# Patient Record
Sex: Female | Born: 1970 | Race: White | Hispanic: No | Marital: Single | Smoking: Never smoker
Health system: Southern US, Community
[De-identification: ages and names within clinical notes are randomized; demographics above are authoritative.]

## PROBLEM LIST (undated history)

## (undated) DIAGNOSIS — M2550 Pain in unspecified joint: Secondary | ICD-10-CM

## (undated) DIAGNOSIS — K219 Gastro-esophageal reflux disease without esophagitis: Secondary | ICD-10-CM

## (undated) DIAGNOSIS — E782 Mixed hyperlipidemia: Secondary | ICD-10-CM

## (undated) DIAGNOSIS — M549 Dorsalgia, unspecified: Secondary | ICD-10-CM

## (undated) DIAGNOSIS — F419 Anxiety disorder, unspecified: Secondary | ICD-10-CM

## (undated) DIAGNOSIS — I10 Essential (primary) hypertension: Secondary | ICD-10-CM

## (undated) DIAGNOSIS — E119 Type 2 diabetes mellitus without complications: Secondary | ICD-10-CM

## (undated) DIAGNOSIS — F32A Depression, unspecified: Secondary | ICD-10-CM

## (undated) DIAGNOSIS — N979 Female infertility, unspecified: Secondary | ICD-10-CM

## (undated) DIAGNOSIS — K862 Cyst of pancreas: Secondary | ICD-10-CM

## (undated) DIAGNOSIS — K5900 Constipation, unspecified: Secondary | ICD-10-CM

## (undated) DIAGNOSIS — K589 Irritable bowel syndrome without diarrhea: Secondary | ICD-10-CM

## (undated) DIAGNOSIS — K760 Fatty (change of) liver, not elsewhere classified: Secondary | ICD-10-CM

## (undated) DIAGNOSIS — E669 Obesity, unspecified: Secondary | ICD-10-CM

## (undated) DIAGNOSIS — E282 Polycystic ovarian syndrome: Secondary | ICD-10-CM

## (undated) DIAGNOSIS — R011 Cardiac murmur, unspecified: Secondary | ICD-10-CM

## (undated) DIAGNOSIS — R739 Hyperglycemia, unspecified: Secondary | ICD-10-CM

## (undated) DIAGNOSIS — Z8739 Personal history of other diseases of the musculoskeletal system and connective tissue: Secondary | ICD-10-CM

## (undated) DIAGNOSIS — R079 Chest pain, unspecified: Secondary | ICD-10-CM

## (undated) DIAGNOSIS — E876 Hypokalemia: Secondary | ICD-10-CM

## (undated) DIAGNOSIS — J45909 Unspecified asthma, uncomplicated: Secondary | ICD-10-CM

## (undated) DIAGNOSIS — G4700 Insomnia, unspecified: Secondary | ICD-10-CM

## (undated) DIAGNOSIS — R809 Proteinuria, unspecified: Secondary | ICD-10-CM

## (undated) HISTORY — DX: Hyperglycemia, unspecified: R73.9

## (undated) HISTORY — DX: Gastro-esophageal reflux disease without esophagitis: K21.9

## (undated) HISTORY — DX: Proteinuria, unspecified: R80.9

## (undated) HISTORY — DX: Unspecified asthma, uncomplicated: J45.909

## (undated) HISTORY — DX: Polycystic ovarian syndrome: E28.2

## (undated) HISTORY — DX: Depression, unspecified: F32.A

## (undated) HISTORY — DX: Hypercalcemia: E83.52

## (undated) HISTORY — DX: Constipation, unspecified: K59.00

## (undated) HISTORY — DX: Cyst of pancreas: K86.2

## (undated) HISTORY — DX: Irritable bowel syndrome, unspecified: K58.9

## (undated) HISTORY — DX: Fatty (change of) liver, not elsewhere classified: K76.0

## (undated) HISTORY — DX: Hypokalemia: E87.6

## (undated) HISTORY — DX: Essential (primary) hypertension: I10

## (undated) HISTORY — PX: CHOLECYSTECTOMY: SHX55

## (undated) HISTORY — DX: Personal history of other diseases of the musculoskeletal system and connective tissue: Z87.39

## (undated) HISTORY — DX: Type 2 diabetes mellitus without complications: E11.9

## (undated) HISTORY — DX: Cardiac murmur, unspecified: R01.1

## (undated) HISTORY — DX: Pain in unspecified joint: M25.50

## (undated) HISTORY — DX: Female infertility, unspecified: N97.9

## (undated) HISTORY — DX: Obesity, unspecified: E66.9

## (undated) HISTORY — DX: Insomnia, unspecified: G47.00

## (undated) HISTORY — DX: Dorsalgia, unspecified: M54.9

## (undated) HISTORY — DX: Mixed hyperlipidemia: E78.2

## (undated) HISTORY — DX: Chest pain, unspecified: R07.9

## (undated) HISTORY — DX: Anxiety disorder, unspecified: F41.9

---

## 1998-02-04 ENCOUNTER — Ambulatory Visit (HOSPITAL_COMMUNITY): Admission: RE | Admit: 1998-02-04 | Discharge: 1998-02-04 | Payer: Self-pay | Admitting: Cardiology

## 2001-02-27 ENCOUNTER — Encounter: Payer: Self-pay | Admitting: Family Medicine

## 2001-02-27 ENCOUNTER — Ambulatory Visit (HOSPITAL_COMMUNITY): Admission: RE | Admit: 2001-02-27 | Discharge: 2001-02-27 | Payer: Self-pay | Admitting: Family Medicine

## 2001-03-28 ENCOUNTER — Encounter (INDEPENDENT_AMBULATORY_CARE_PROVIDER_SITE_OTHER): Payer: Self-pay | Admitting: Internal Medicine

## 2001-03-28 ENCOUNTER — Encounter: Payer: Self-pay | Admitting: Cardiology

## 2001-03-28 ENCOUNTER — Ambulatory Visit (HOSPITAL_COMMUNITY): Admission: RE | Admit: 2001-03-28 | Discharge: 2001-03-29 | Payer: Self-pay | Admitting: Cardiology

## 2003-10-11 ENCOUNTER — Emergency Department (HOSPITAL_COMMUNITY): Admission: EM | Admit: 2003-10-11 | Discharge: 2003-10-12 | Payer: Self-pay | Admitting: Emergency Medicine

## 2003-11-03 ENCOUNTER — Ambulatory Visit (HOSPITAL_COMMUNITY): Admission: RE | Admit: 2003-11-03 | Discharge: 2003-11-03 | Payer: Self-pay | Admitting: Family Medicine

## 2005-02-13 ENCOUNTER — Ambulatory Visit (HOSPITAL_COMMUNITY): Admission: RE | Admit: 2005-02-13 | Discharge: 2005-02-13 | Payer: Self-pay | Admitting: Family Medicine

## 2005-12-13 ENCOUNTER — Emergency Department (HOSPITAL_COMMUNITY): Admission: EM | Admit: 2005-12-13 | Discharge: 2005-12-13 | Payer: Self-pay | Admitting: Emergency Medicine

## 2006-01-30 LAB — CONVERTED CEMR LAB: Pap Smear: NORMAL

## 2006-03-27 ENCOUNTER — Emergency Department (HOSPITAL_COMMUNITY): Admission: EM | Admit: 2006-03-27 | Discharge: 2006-03-27 | Payer: Self-pay | Admitting: Emergency Medicine

## 2006-03-30 ENCOUNTER — Emergency Department (HOSPITAL_COMMUNITY): Admission: EM | Admit: 2006-03-30 | Discharge: 2006-03-30 | Payer: Self-pay | Admitting: Emergency Medicine

## 2006-05-10 ENCOUNTER — Encounter: Admission: RE | Admit: 2006-05-10 | Discharge: 2006-05-10 | Payer: Self-pay | Admitting: Family Medicine

## 2006-05-11 ENCOUNTER — Encounter: Admission: RE | Admit: 2006-05-11 | Discharge: 2006-05-11 | Payer: Self-pay | Admitting: Family Medicine

## 2006-07-01 ENCOUNTER — Encounter (INDEPENDENT_AMBULATORY_CARE_PROVIDER_SITE_OTHER): Payer: Self-pay | Admitting: Internal Medicine

## 2006-08-29 ENCOUNTER — Ambulatory Visit: Payer: Self-pay | Admitting: Internal Medicine

## 2006-08-29 DIAGNOSIS — I10 Essential (primary) hypertension: Secondary | ICD-10-CM | POA: Insufficient documentation

## 2006-08-29 DIAGNOSIS — J309 Allergic rhinitis, unspecified: Secondary | ICD-10-CM | POA: Insufficient documentation

## 2006-09-27 ENCOUNTER — Ambulatory Visit: Payer: Self-pay | Admitting: Internal Medicine

## 2006-09-30 ENCOUNTER — Telehealth (INDEPENDENT_AMBULATORY_CARE_PROVIDER_SITE_OTHER): Payer: Self-pay | Admitting: Internal Medicine

## 2006-10-02 ENCOUNTER — Ambulatory Visit: Payer: Self-pay | Admitting: Internal Medicine

## 2006-10-02 DIAGNOSIS — F329 Major depressive disorder, single episode, unspecified: Secondary | ICD-10-CM | POA: Insufficient documentation

## 2006-10-02 DIAGNOSIS — G4700 Insomnia, unspecified: Secondary | ICD-10-CM | POA: Insufficient documentation

## 2006-10-02 DIAGNOSIS — K862 Cyst of pancreas: Secondary | ICD-10-CM | POA: Insufficient documentation

## 2006-10-02 DIAGNOSIS — K863 Pseudocyst of pancreas: Secondary | ICD-10-CM

## 2006-10-15 ENCOUNTER — Encounter (INDEPENDENT_AMBULATORY_CARE_PROVIDER_SITE_OTHER): Payer: Self-pay | Admitting: Internal Medicine

## 2006-10-25 ENCOUNTER — Encounter (INDEPENDENT_AMBULATORY_CARE_PROVIDER_SITE_OTHER): Payer: Self-pay | Admitting: Internal Medicine

## 2006-11-08 ENCOUNTER — Ambulatory Visit: Payer: Self-pay | Admitting: Internal Medicine

## 2006-11-08 DIAGNOSIS — D239 Other benign neoplasm of skin, unspecified: Secondary | ICD-10-CM | POA: Insufficient documentation

## 2006-11-11 ENCOUNTER — Encounter (INDEPENDENT_AMBULATORY_CARE_PROVIDER_SITE_OTHER): Payer: Self-pay | Admitting: Internal Medicine

## 2006-12-04 ENCOUNTER — Encounter (INDEPENDENT_AMBULATORY_CARE_PROVIDER_SITE_OTHER): Payer: Self-pay | Admitting: Internal Medicine

## 2007-02-13 ENCOUNTER — Ambulatory Visit: Payer: Self-pay | Admitting: Internal Medicine

## 2007-02-27 ENCOUNTER — Encounter (INDEPENDENT_AMBULATORY_CARE_PROVIDER_SITE_OTHER): Payer: Self-pay | Admitting: Internal Medicine

## 2007-02-27 LAB — CONVERTED CEMR LAB
BUN: 9 mg/dL (ref 6–23)
CO2: 30 meq/L (ref 19–32)
Chloride: 103 meq/L (ref 96–112)
Potassium: 3.6 meq/L (ref 3.5–5.3)

## 2007-03-07 ENCOUNTER — Encounter (INDEPENDENT_AMBULATORY_CARE_PROVIDER_SITE_OTHER): Payer: Self-pay | Admitting: Internal Medicine

## 2007-03-08 ENCOUNTER — Encounter: Admission: RE | Admit: 2007-03-08 | Discharge: 2007-03-08 | Payer: Self-pay | Admitting: Internal Medicine

## 2007-03-11 ENCOUNTER — Encounter (INDEPENDENT_AMBULATORY_CARE_PROVIDER_SITE_OTHER): Payer: Self-pay | Admitting: Internal Medicine

## 2007-03-12 ENCOUNTER — Encounter (INDEPENDENT_AMBULATORY_CARE_PROVIDER_SITE_OTHER): Payer: Self-pay | Admitting: Internal Medicine

## 2007-03-13 ENCOUNTER — Encounter (INDEPENDENT_AMBULATORY_CARE_PROVIDER_SITE_OTHER): Payer: Self-pay | Admitting: Internal Medicine

## 2007-03-13 LAB — CONVERTED CEMR LAB

## 2007-03-28 ENCOUNTER — Encounter (INDEPENDENT_AMBULATORY_CARE_PROVIDER_SITE_OTHER): Payer: Self-pay | Admitting: Internal Medicine

## 2007-03-31 LAB — CONVERTED CEMR LAB
Albumin: 4.3 g/dL (ref 3.5–5.2)
CO2: 25 meq/L (ref 19–32)
Calcium: 8.9 mg/dL (ref 8.4–10.5)
Cholesterol: 210 mg/dL — ABNORMAL HIGH (ref 0–200)
Eosinophils Relative: 3 % (ref 0–5)
Glucose, Bld: 108 mg/dL — ABNORMAL HIGH (ref 70–99)
HCT: 46.4 % — ABNORMAL HIGH (ref 36.0–46.0)
Lymphocytes Relative: 22 % (ref 12–46)
Lymphs Abs: 1.7 10*3/uL (ref 0.7–3.3)
Neutrophils Relative %: 70 % (ref 43–77)
Platelets: 347 10*3/uL (ref 150–400)
Potassium: 3.7 meq/L (ref 3.5–5.3)
Sodium: 144 meq/L (ref 135–145)
Total Protein: 7.1 g/dL (ref 6.0–8.3)
Triglycerides: 512 mg/dL — ABNORMAL HIGH (ref <150)
WBC: 8 10*3/uL (ref 4.0–10.5)

## 2007-04-03 ENCOUNTER — Encounter (INDEPENDENT_AMBULATORY_CARE_PROVIDER_SITE_OTHER): Payer: Self-pay | Admitting: Internal Medicine

## 2007-04-10 ENCOUNTER — Ambulatory Visit (HOSPITAL_COMMUNITY): Admission: RE | Admit: 2007-04-10 | Discharge: 2007-04-10 | Payer: Self-pay | Admitting: Internal Medicine

## 2007-04-10 ENCOUNTER — Ambulatory Visit: Payer: Self-pay | Admitting: Internal Medicine

## 2007-05-08 ENCOUNTER — Telehealth (INDEPENDENT_AMBULATORY_CARE_PROVIDER_SITE_OTHER): Payer: Self-pay | Admitting: *Deleted

## 2007-05-14 ENCOUNTER — Telehealth (INDEPENDENT_AMBULATORY_CARE_PROVIDER_SITE_OTHER): Payer: Self-pay | Admitting: Internal Medicine

## 2007-06-19 ENCOUNTER — Telehealth (INDEPENDENT_AMBULATORY_CARE_PROVIDER_SITE_OTHER): Payer: Self-pay | Admitting: *Deleted

## 2007-08-27 ENCOUNTER — Telehealth (INDEPENDENT_AMBULATORY_CARE_PROVIDER_SITE_OTHER): Payer: Self-pay | Admitting: Internal Medicine

## 2007-08-28 DIAGNOSIS — R7309 Other abnormal glucose: Secondary | ICD-10-CM | POA: Insufficient documentation

## 2007-09-30 ENCOUNTER — Encounter (INDEPENDENT_AMBULATORY_CARE_PROVIDER_SITE_OTHER): Payer: Self-pay | Admitting: Internal Medicine

## 2007-11-03 ENCOUNTER — Telehealth (INDEPENDENT_AMBULATORY_CARE_PROVIDER_SITE_OTHER): Payer: Self-pay | Admitting: *Deleted

## 2007-11-12 ENCOUNTER — Ambulatory Visit: Payer: Self-pay | Admitting: Internal Medicine

## 2007-11-12 DIAGNOSIS — R609 Edema, unspecified: Secondary | ICD-10-CM | POA: Insufficient documentation

## 2007-12-10 ENCOUNTER — Encounter (INDEPENDENT_AMBULATORY_CARE_PROVIDER_SITE_OTHER): Payer: Self-pay | Admitting: Internal Medicine

## 2008-02-16 ENCOUNTER — Telehealth (INDEPENDENT_AMBULATORY_CARE_PROVIDER_SITE_OTHER): Payer: Self-pay | Admitting: *Deleted

## 2008-03-03 ENCOUNTER — Encounter (INDEPENDENT_AMBULATORY_CARE_PROVIDER_SITE_OTHER): Payer: Self-pay | Admitting: Internal Medicine

## 2008-04-19 ENCOUNTER — Encounter (INDEPENDENT_AMBULATORY_CARE_PROVIDER_SITE_OTHER): Payer: Self-pay | Admitting: Internal Medicine

## 2008-05-26 ENCOUNTER — Encounter (INDEPENDENT_AMBULATORY_CARE_PROVIDER_SITE_OTHER): Payer: Self-pay | Admitting: Internal Medicine

## 2008-06-09 ENCOUNTER — Ambulatory Visit: Payer: Self-pay | Admitting: Internal Medicine

## 2008-06-09 DIAGNOSIS — E785 Hyperlipidemia, unspecified: Secondary | ICD-10-CM | POA: Insufficient documentation

## 2008-12-22 ENCOUNTER — Ambulatory Visit: Payer: Self-pay | Admitting: Internal Medicine

## 2009-02-22 ENCOUNTER — Ambulatory Visit: Payer: Self-pay | Admitting: Internal Medicine

## 2009-02-22 DIAGNOSIS — L909 Atrophic disorder of skin, unspecified: Secondary | ICD-10-CM | POA: Insufficient documentation

## 2009-02-22 DIAGNOSIS — L919 Hypertrophic disorder of the skin, unspecified: Secondary | ICD-10-CM

## 2009-03-09 ENCOUNTER — Encounter (INDEPENDENT_AMBULATORY_CARE_PROVIDER_SITE_OTHER): Payer: Self-pay | Admitting: Internal Medicine

## 2010-07-30 LAB — CONVERTED CEMR LAB
Albumin: 4.4 g/dL
Alkaline Phosphatase: 92 units/L
CO2: 27 meq/L
Calcium: 9.2 mg/dL
Chloride: 104 meq/L
Cholesterol: 200 mg/dL
Glucose, Bld: 90 mg/dL
Lymphocytes Relative: 24 %
Lymphs Abs: 2.5 10*3/uL
Monocytes Relative: 6 %
Neutro Abs: 6.9 10*3/uL
Neutrophils Relative %: 68 %
Platelets: 383 10*3/uL
Potassium: 4.1 meq/L
RBC: 5.12 M/uL
Sodium: 143 meq/L
Total CHOL/HDL Ratio: 4.1
Total Protein: 7.3 g/dL
Triglycerides: 319 mg/dL
WBC: 10.2 10*3/uL

## 2010-11-17 NOTE — Cardiovascular Report (Signed)
[facility]  Patient:    Loretta Luna, Loretta Luna Visit Number: [id number] MRN: [id number]          Service Type: CAT Location: [facility] 2858 [id number] Attending Physician:  Ophelia Shoulder Dictated by:   Madaline Savage, M.D. Proc. Date: 03/28/01 Admit Date:  03/28/2001                          Cardiac Catheterization  PROCEDURE: 1. Selective coronary angiography by Judkins technique. 2. Retrograde left heart catheterization. 3. Left ventricular angiography.  Complications:  None. Entry site:     Right femoral artery. Dye used:       Omnipaque. Catheters:      Judkins 4.0 left, Judkins 4.0 right.                 Arterial: 87F pigtail, 87F introducer sheath.  PATIENT PROFILE:  The patient is a 40 year old female with a strong family history of coronary artery disease who takes oral contraceptives, who has had some episodes of chest discomfort.  A Persantine Cardiolite stress test recently performed showed suggestion of anterolateral ischemia with a normal ejection fraction.  She presents today for outpatient cardiac catheterization in view of her abnormal Cardiolite.  PRESSURES:  The left ventricular pressure and central aortic pressure were both normal.  There was no gradient by pullback technique.  ANGIOGRAPHIC RESULTS:  The left main coronary artery was normal.  The left anterior descending coronary artery gave rise to one major diagonal branch and neither the LAD nor the diagonal showed lesions.  The circumflex was a dominant vessel giving rise to two obtuse marginal branches and a large posterior descending branch.  The right coronary artery was a medium-size vessel with no lesions.  LEFT VENTRICULOGRAPHY:  The left ventricular angiography shows normal contractility.  Ejection fraction was normal and estimated at 60%.  No mitral regurgitation was seen.  FINAL DIAGNOSES: 1. Normal coronary arteries. 2. Normal left ventricular systolic  function.  ADDENDUM:  The patient was screened for Perclose arteriotomy by performing a 45 degree RAO hand-injected angiogram of her femoral artery.  When the Perclose device was placed, the device did not capture tissue and was therefore unsuccessful and digital manual pressure was held instead. Dictated by:   Madaline Savage, M.D. Attending Physician:  Ophelia Shoulder DD:  03/28/01 TD:  03/28/01 Job: [id number]

## 2010-11-17 NOTE — Procedures (Signed)
Loretta Luna, Loretta Luna                 ACCOUNT NO.:  [id number]   MEDICAL RECORD NO.:  [id number]          PATIENT TYPE:  OUT   LOCATION:  RAD                           FACILITY:  [facility]   PHYSICIAN:  Madaline Savage, M.D.DATE OF BIRTH:  Mar 20, 1971   DATE OF PROCEDURE:  02/13/2005  DATE OF DISCHARGE:                                  ECHOCARDIOGRAM   PROCEDURES:  Two-dimensional echocardiogram.   REFERRING PHYSICIAN:  Corrie Mckusick, M.D.   DATE OF STUDY:  February 13, 2005.   INDICATIONS FOR PROCEDURE:  Cardiac murmur and new edema.  The patient has a  history of hypertension and is 40 years of age.   Technically the study was suboptimal in terms of image quality but adequate.   RESULTS:  1.  Aorta: Normal aortic root dimension noted of 2.5.  2.  Left atrium:  Left atrial size normal.  3.  Right atrium:  Normal.  4.  Right ventricle:  Not well seen, probably within normal limits.  5.  Left ventricle:  Borderline left ventricular hypertrophy of a concentric      nature.  Posterior and septal wall measurements 1.1.  LV size normal,      end-systolic dimension 2.9 and diastolic dimension 4.  Ejection fraction      felt to be approximately 55 to 60%.  No wall motion abnormalities      appreciated.  6.  Right ventricle:  Not well seen.  7.  Pericardium:  No effusion noted.  8.  Aortic valve:  Aortic valve opens and closes normally.  Mean gradient      across valve 8.  9.  Mitral valve: Unremarkable.  10. Pulmonic valve:  Not well seen.  11. Tricuspid valve:  Not well seen, probably normal.  No TR seen.   FINAL DIAGNOSES:  1.  No significant valvular disease noted.  2.  Right ventricle not well seen.  3.  Left ventricular systolic function normal.  4.  Borderline left ventricular hypertrophy.           ______________________________  Madaline Savage, M.D.     WHG/MEDQ  D:  02/13/2005  T:  02/13/2005  Job:  [id number]   cc:   Corrie Mckusick, M.D.  Fax: [phone]   [facility]  [place]   [facility]

## 2011-02-23 ENCOUNTER — Ambulatory Visit: Payer: PRIVATE HEALTH INSURANCE | Attending: Internal Medicine | Admitting: Sleep Medicine

## 2011-02-23 DIAGNOSIS — R5383 Other fatigue: Secondary | ICD-10-CM

## 2011-02-23 DIAGNOSIS — G4730 Sleep apnea, unspecified: Secondary | ICD-10-CM | POA: Insufficient documentation

## 2011-02-23 DIAGNOSIS — Z6841 Body Mass Index (BMI) 40.0 and over, adult: Secondary | ICD-10-CM | POA: Insufficient documentation

## 2011-02-23 DIAGNOSIS — G4710 Hypersomnia, unspecified: Secondary | ICD-10-CM | POA: Insufficient documentation

## 2011-02-23 DIAGNOSIS — R5381 Other malaise: Secondary | ICD-10-CM

## 2011-02-25 NOTE — Procedures (Signed)
Loretta Luna, Loretta Luna                 ACCOUNT NO.:  [id number]  MEDICAL RECORD NO.:  [id number]          PATIENT TYPE:  OUT  LOCATION:  SLEEP LAB                     FACILITY:  [facility]  PHYSICIAN:  Ripken Rekowski A. Gerilyn Pilgrim, M.D. DATE OF BIRTH:  December 03, 1970  DATE OF STUDY:  02/23/2011                           NOCTURNAL POLYSOMNOGRAM  REFERRING PHYSICIAN:  ZACK HALL  INDICATION FOR STUDY:  A 40 year old who presents with significant hypersomnia, fatigue and snoring.  Study is being done to evaluate for obstructive sleep apnea syndrome.  EPWORTH SLEEPINESS SCORE: 1. BMI 49.  MEDICATIONS:  Pravachol, Zoloft, pravastatin, Glucophage, Avapro.  SLEEP ARCHITECTURE:  The total recording time is 457 minutes.  Sleep efficiency 83%.  Sleep latency 20 minutes.  REM latency 160 minutes. Stage N1 is 6.2%, N2 54%, N3 29%, and REM sleep 11%.  RESPIRATORY DATA:  Baseline oxygen saturation is 98.  Lowest saturation 90.  Diagnostic AHI is 1.4 and RDI 2.  LIMB MOVEMENT SUMMARY:  PLM index 1.  ELECTROCARDIOGRAM SUMMARY:  Average heart rate is 80 with no significant dysrhythmias observed.  IMPRESSION:  Unremarkable nocturnal polysomnography report.  Thanks for this referral.     Donny Heffern A. Gerilyn Pilgrim, M.D.    KAD/MEDQ  D:  02/24/2011 19:31:13  T:  02/25/2011 02:52:46  Job:  [id number]

## 2011-09-03 ENCOUNTER — Encounter: Payer: 59 | Attending: Internal Medicine | Admitting: *Deleted

## 2011-09-03 VITALS — Ht 63.5 in | Wt 271.2 lb

## 2011-09-03 DIAGNOSIS — R7309 Other abnormal glucose: Secondary | ICD-10-CM

## 2011-09-10 ENCOUNTER — Encounter: Payer: Self-pay | Admitting: *Deleted

## 2011-09-12 ENCOUNTER — Encounter: Payer: Self-pay | Admitting: *Deleted

## 2011-09-12 NOTE — Progress Notes (Signed)
  Patient was seen on 09/03/2011 for the first of a series of three diabetes self-management courses at [facility]. The following learning objectives were met by the patient during this course:   Defines the role of glucose and insulin  Identifies type of diabetes and pathophysiology  Defines the diagnostic criteria for diabetes and prediabetes  States the risk factors for Type 2 Diabetes  States the symptoms of Type 2 Diabetes  Defines Type 2 Diabetes treatment goals  Defines Type 2 Diabetes treatment options  States the rationale for glucose monitoring  Identifies A1C, glucose targets, and testing times  Identifies proper sharps disposal  Defines the purpose of a diabetes food plan  Identifies carbohydrate food groups  Defines effects of carbohydrate foods on glucose levels  Identifies carbohydrate choices/grams/food labels  States benefits of physical activity and effect on glucose  Review of suggested activity guidelines  Handouts given during class include:  Type 2 Diabetes: Basics Book  My Food Plan Book  Food and Activity Log  Follow-Up Plan: Attend Core 2 Class on April 9th, 2013

## 2011-09-12 NOTE — Patient Instructions (Signed)
Goals:  Follow Diabetes Meal Plan as instructed  Eat 3 meals and 2 snacks, every 3-5 hrs  Limit carbohydrate intake to 30-45 grams carbohydrate/meal  Limit carbohydrate intake to 0-15 grams carbohydrate/snack  Add lean protein foods to meals/snacks  Monitor glucose levels as instructed by your doctor  Aim for 15-30 mins of physical activity daily  Bring food record and glucose log to your next nutrition visit   

## 2011-10-09 ENCOUNTER — Encounter: Payer: 59 | Attending: Internal Medicine | Admitting: Dietician

## 2011-10-10 NOTE — Progress Notes (Signed)
  Patient was seen on 10/09/2011 for the second of a series of three diabetes self-management courses at [facility]. The following learning objectives were met by the patient during this course:   Explain basic nutrition maintenance and quality assurance  Describe causes, symptoms and treatment of hypoglycemia and hyperglycemia  Explain how to manage diabetes during illness  Describe the importance of good nutrition for health and healthy eating strategies  List strategies to follow meal plan when dining out  Describe the effects of alcohol on glucose and how to use it safely  Describe problem solving skills for day-to-day glucose challenges  Describe strategies to use when treatment plan needs to change  Identify important factors involved in successful weight loss  Describe ways to remain physically active  Describe the impact of regular activity on insulin resistance  Patient updated their initials goals from Core Class I to include: Continues to finalize self-management goals for Core Class 3.  Handouts given in class:  Refrigerator magnet for Sick Day Guidelines  [facility] Oral medication/insulin handout  Follow-Up Plan: Patient will attend the final class of the ADA Diabetes Self-Care Education.

## 2011-11-20 ENCOUNTER — Encounter: Payer: 59 | Attending: Internal Medicine

## 2011-11-20 DIAGNOSIS — I10 Essential (primary) hypertension: Secondary | ICD-10-CM | POA: Insufficient documentation

## 2011-11-20 DIAGNOSIS — Z713 Dietary counseling and surveillance: Secondary | ICD-10-CM | POA: Insufficient documentation

## 2011-11-20 DIAGNOSIS — R7309 Other abnormal glucose: Secondary | ICD-10-CM

## 2011-11-20 DIAGNOSIS — E119 Type 2 diabetes mellitus without complications: Secondary | ICD-10-CM | POA: Insufficient documentation

## 2011-11-26 NOTE — Progress Notes (Signed)
  Patient was seen on 11/20/2011 for the third of a series of three diabetes self-management courses at the [facility]. The following learning objectives were met by the patient during this course:    Describe how diabetes changes over time   Identify diabetes complications and ways to prevent them   Describe strategies that can promote heart health including lowering blood pressure and cholesterol   Describe strategies to lower dietary fat and sodium in the diet   Identify physical activities that benefit cardiovascular health   Evaluate success in meeting personal goal   Describe the belief that they can live successfully with diabetes day to day   Establish 2-3 goals that they will plan to diligently work on until they return for the free 40-month follow-up visit  The following handouts were given in class:  3 Month Follow Up Visit handout  Goal setting handout  Class evaluation form  Your patient has established the following 3 month goals for diabetes self-care:  Count carbohydrates at most of my meals and snacks.  Be active 20 or more minutes or more 3 times a week.   Take my diabetes medications as scheduled.  Follow-Up Plan: Patient will attend a 3 month follow-up visit for diabetes self-management education.

## 2012-02-21 ENCOUNTER — Ambulatory Visit: Payer: 59 | Admitting: Dietician

## 2012-03-04 ENCOUNTER — Encounter: Payer: 59 | Attending: Internal Medicine | Admitting: Dietician

## 2012-03-04 VITALS — Ht 63.5 in | Wt 260.0 lb

## 2012-03-04 DIAGNOSIS — Z713 Dietary counseling and surveillance: Secondary | ICD-10-CM | POA: Insufficient documentation

## 2012-03-04 DIAGNOSIS — R7309 Other abnormal glucose: Secondary | ICD-10-CM

## 2012-03-04 DIAGNOSIS — E119 Type 2 diabetes mellitus without complications: Secondary | ICD-10-CM | POA: Insufficient documentation

## 2012-03-04 NOTE — Progress Notes (Signed)
  Patient was seen on 03/04/2012 for their 3 month follow-up as a part of the diabetes self-management courses at the [facility]. 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  She has lost some weight.  Wishes to discuss starting the Victoza with Dr. Margo Aye Confidence with ability to manage diabetes: Feels more confident Areas for improvement with diabetes self-care: Increase her exercise, lose more weight. Willingness to participate in diabetes support group: Issues with the long distance commute to attend. Checking her feet: yes Dilated eye exam in the last year: yes Checking blood glucose: not as often:  Only 2-3 times per week Following a meal plan: yes Regular exercise: Not at this time with the warmer weather.  Looking to start up at a gym in the near future. Weight Loss: yes 7.2 lb since class 1 in march.  Please see Diabetes Flow sheet for findings related to patient's self-care.  Follow-Up Plan: Patient is eligible for a "free" 30 minute diabetes self-care appointment in the next year. Patient to call and schedule as needed.

## 2012-03-19 ENCOUNTER — Telehealth: Payer: Self-pay | Admitting: *Deleted

## 2012-03-20 NOTE — Telephone Encounter (Signed)
Patient notified of Path results.   

## 2012-07-15 ENCOUNTER — Telehealth (INDEPENDENT_AMBULATORY_CARE_PROVIDER_SITE_OTHER): Payer: Self-pay | Admitting: *Deleted

## 2012-07-15 DIAGNOSIS — R197 Diarrhea, unspecified: Secondary | ICD-10-CM

## 2012-07-15 DIAGNOSIS — R109 Unspecified abdominal pain: Secondary | ICD-10-CM

## 2012-07-15 LAB — BASIC METABOLIC PANEL
CO2: 28 mEq/L (ref 19–32)
Chloride: 100 mEq/L (ref 96–112)
Creat: 0.77 mg/dL (ref 0.50–1.10)
Potassium: 3.4 mEq/L — ABNORMAL LOW (ref 3.5–5.3)

## 2012-07-15 LAB — CBC WITH DIFFERENTIAL/PLATELET
Eosinophils Relative: 2 % (ref 0–5)
HCT: 38 % (ref 36.0–46.0)
Lymphocytes Relative: 19 % (ref 12–46)
Lymphs Abs: 1.5 10*3/uL (ref 0.7–4.0)
MCV: 78 fL (ref 78.0–100.0)
Monocytes Absolute: 0.6 10*3/uL (ref 0.1–1.0)
Platelets: 296 10*3/uL (ref 150–400)
RBC: 4.87 MIL/uL (ref 3.87–5.11)
WBC: 8.2 10*3/uL (ref 4.0–10.5)

## 2012-07-15 NOTE — Telephone Encounter (Signed)
Per Dr.Rehman the patient may get the following labs done  CBC/D/Met 7

## 2012-10-16 ENCOUNTER — Telehealth (INDEPENDENT_AMBULATORY_CARE_PROVIDER_SITE_OTHER): Payer: Self-pay | Admitting: Internal Medicine

## 2012-10-16 DIAGNOSIS — K649 Unspecified hemorrhoids: Secondary | ICD-10-CM

## 2012-10-16 MED ORDER — HYDROCORTISONE ACE-PRAMOXINE 1-1 % RE FOAM: 1.0000 | Freq: Two times a day (BID) | RECTAL | Status: AC

## 2012-10-16 NOTE — Telephone Encounter (Signed)
Rx eprescribed 

## 2013-10-06 ENCOUNTER — Other Ambulatory Visit (HOSPITAL_COMMUNITY): Payer: Self-pay | Admitting: Internal Medicine

## 2013-10-06 DIAGNOSIS — Z1231 Encounter for screening mammogram for malignant neoplasm of breast: Secondary | ICD-10-CM

## 2013-10-09 ENCOUNTER — Ambulatory Visit (HOSPITAL_COMMUNITY)
Admission: RE | Admit: 2013-10-09 | Discharge: 2013-10-09 | Disposition: A | Discharge status: Home / Self Care | Payer: 59 | Source: Ambulatory Visit | Attending: Internal Medicine | Admitting: Internal Medicine

## 2013-10-09 DIAGNOSIS — Z1231 Encounter for screening mammogram for malignant neoplasm of breast: Secondary | ICD-10-CM | POA: Insufficient documentation

## 2014-03-15 ENCOUNTER — Other Ambulatory Visit (INDEPENDENT_AMBULATORY_CARE_PROVIDER_SITE_OTHER): Payer: Self-pay | Admitting: Internal Medicine

## 2014-05-21 ENCOUNTER — Telehealth (INDEPENDENT_AMBULATORY_CARE_PROVIDER_SITE_OTHER): Payer: Self-pay | Admitting: Internal Medicine

## 2014-05-21 DIAGNOSIS — J322 Chronic ethmoidal sinusitis: Secondary | ICD-10-CM

## 2014-05-21 MED ORDER — AZITHROMYCIN 250 MG PO TABS: ORAL_TABLET | ORAL | Status: DC

## 2014-05-21 NOTE — Telephone Encounter (Signed)
Sinusitis. Will call in Z pack.

## 2014-07-05 ENCOUNTER — Telehealth (INDEPENDENT_AMBULATORY_CARE_PROVIDER_SITE_OTHER): Payer: Self-pay | Admitting: Internal Medicine

## 2014-07-05 NOTE — Telephone Encounter (Signed)
error 

## 2014-09-02 ENCOUNTER — Telehealth (INDEPENDENT_AMBULATORY_CARE_PROVIDER_SITE_OTHER): Payer: Self-pay | Admitting: Internal Medicine

## 2014-09-02 MED ORDER — AMOXICILLIN 500 MG PO CAPS: 500.0000 mg | ORAL_CAPSULE | Freq: Three times a day (TID) | ORAL | Status: DC

## 2014-09-02 NOTE — Telephone Encounter (Signed)
Rx for amoxicillin sent to pharmacy for sorethroat.

## 2015-01-24 ENCOUNTER — Encounter: Payer: Self-pay | Admitting: *Deleted

## 2015-01-24 ENCOUNTER — Other Ambulatory Visit: Payer: Self-pay | Admitting: *Deleted

## 2015-01-24 NOTE — Patient Outreach (Signed)
[facility]) Care Management   01/24/2015  Loretta Luna 1970/08/17 [id number]  Loretta Luna is an 44 y.o. female who presents for routine [facility] To [facility]ness follow up for Type II DM self management assistance. She brings her lab results from 12/22/14 with her.  Subjective:  Loretta Luna has no complaints, says she had her follow up ultrasound of her pancreas in April at [facility] to monitor a cyst discovered in 2007 and said she was not called with the results so she assumes the cyst was unchanged. She says her 71 year old son Loretta Luna Kitchen broke his hand on the last day of school and he is still in a brace. He will be starting high school in the fall. She says she is pleased her A1C and triglycerides have improved. She attributes the improvement to changes made in her medication regimen: Invokana, Zetia, Crestor and the resumption of Lovaza.  Objective:   Review of Systems  Constitutional: Negative.    Filed Vitals:   01/24/15 1245  BP: 110/60  POC pre lunch CBG= 120  Physical Exam  Constitutional: She is oriented to person, place, and time. She appears well-developed and well-nourished.  Neurological: She is alert and oriented to person, place, and time.  Skin: Skin is warm and dry.  Psychiatric: She has a normal mood and affect. Her behavior is normal. Thought content normal.    Current Medications:   Outpatient Encounter Prescriptions as of 01/24/2015  Medication Sig Note  . Azilsartan-Chlorthalidone (EDARBYCLOR) 40-25 MG TABS Take by mouth daily.   . canagliflozin (INVOKANA) 300 MG TABS tablet Take 300 mg by mouth daily before breakfast.   . ezetimibe (ZETIA) 10 MG tablet Take 10 mg by mouth daily.   . fenofibrate micronized (ANTARA) 130 MG capsule Take 130 mg by mouth daily before breakfast.   . fluconazole (DIFLUCAN) 100 MG tablet TAKE ONE TABLET DAILY AS NEEDED. 01/24/2015: Takes prn while on Invokana  . furosemide (LASIX) 20 MG tablet Take 20 mg by mouth daily.    . Liraglutide (VICTOZA) 18 MG/3ML SOLN Inject into the skin.   . montelukast (SINGULAIR) 10 MG tablet Take 10 mg by mouth at bedtime.   Loretta Luna Kitchen omega-3 acid ethyl esters (LOVAZA) 1 G capsule Take 1 g by mouth 2 (two) times daily.   . rosuvastatin (CRESTOR) 20 MG tablet Take 20 mg by mouth daily.   Loretta Luna Kitchen amoxicillin (AMOXIL) 500 MG capsule Take 1 capsule (500 mg total) by mouth 3 (three) times daily. (Patient not taking: Reported on 01/24/2015)   . azithromycin (ZITHROMAX Z-PAK) 250 MG tablet As directed (Patient not taking: Reported on 01/24/2015)   . fish oil-omega-3 fatty acids 1000 MG capsule Take 600 mg by mouth 2 (two) times daily. 01/24/2015: No longer taking now that she is back on Lovaza  . irbesartan (AVAPRO) 150 MG tablet Take 150 mg by mouth daily.     . metFORMIN (GLUCOPHAGE) 500 MG tablet Take 500 mg by mouth 2 (two) times daily with a meal.   01/24/2015: Did not tolerate due to diarrhea  . pravastatin (PRAVACHOL) 40 MG tablet Take 40 mg by mouth daily.     . sertraline (ZOLOFT) 50 MG tablet Take 50 mg by mouth every morning.      No facility-administered encounter medications on file as of 01/24/2015.     Functional Status:   In your present state of health, do you have any difficulty performing the following activities: 01/24/2015  Hearing? N  Vision? N  Difficulty concentrating or making decisions? N  Walking or climbing stairs? N  Dressing or bathing? N  Doing errands, shopping? N    Fall/Depression Screening:    PHQ 2/9 Scores 01/24/2015  PHQ - 2 Score 0   THN CM Care Plan Problem One        Patient Outreach from 01/24/2015 in [place] Problem One  Type II DM with improving glycemic control as evidenced by improvement in A1C from 7.6% on 09/14/14 to 7.2% on 12/22/14    Care Plan for Problem One  Active   THN Long Term Goal (31-90 days)  Patient will resume checking blood sugars and report checkcing at least 3 x week and will show ongoing improvement in  glycemic control as evidenced by A1C <7.2% at next check   [facility] Long Term Goal Start Date  01/24/15   Interventions for Problem One Long Term Goal  reviewed basic pathophysiology of Type II DM, issued and instructed patient on use of glucometer and encouraged her to resume checking CBG at least 3 x weekly preferably after meals to help her make smarter food and drink choices, will arrange for Link To Wellness follow up based on timing of  her next MD appointment       Assessment:   [employer] employee and Link To Wellness member with Type II DM and improving A1C; 7.2% on 12/22/14.  Plan:  RNCM to fax today's office visit note to Dr. Nevada Crane. RNCM will meet quarterly and as needed with patient per Link To Wellness program guidelines to assist with Type II DM self-management and assess patient's progress toward mutually set goals  Barrington Ellison RN,CCM,CDE [place] Management Coordinator Link To Wellness Office Phone [phone] Office Fax [phone]

## 2015-05-25 ENCOUNTER — Other Ambulatory Visit: Payer: Self-pay | Admitting: *Deleted

## 2015-05-25 NOTE — Patient Outreach (Signed)
Received e-mail from [place] this RNCM that her last day with [facility] is 05/27/15.  She will no longer be eligible for the Link To Wellness program  as of 06/01/15 as this is when her insurance coverage with [facility] stops. Reply e-mail sent to Lianni wishing her well in her new job with [employer]. Barrington Ellison RN,CCM,CDE [place] Management Coordinator Link To Wellness Office Phone [phone] Office Fax [phone]

## 2015-09-26 DIAGNOSIS — R05 Cough: Secondary | ICD-10-CM | POA: Diagnosis not present

## 2015-09-26 DIAGNOSIS — R0981 Nasal congestion: Secondary | ICD-10-CM | POA: Diagnosis not present

## 2017-02-05 ENCOUNTER — Ambulatory Visit: Payer: 59 | Admitting: Nutrition

## 2017-03-14 ENCOUNTER — Ambulatory Visit: Payer: 59 | Admitting: Nutrition

## 2017-05-31 DIAGNOSIS — E1165 Type 2 diabetes mellitus with hyperglycemia: Secondary | ICD-10-CM | POA: Diagnosis not present

## 2017-06-14 DIAGNOSIS — E1165 Type 2 diabetes mellitus with hyperglycemia: Secondary | ICD-10-CM | POA: Diagnosis not present

## 2017-09-03 DIAGNOSIS — I10 Essential (primary) hypertension: Secondary | ICD-10-CM | POA: Diagnosis not present

## 2017-09-03 DIAGNOSIS — E1165 Type 2 diabetes mellitus with hyperglycemia: Secondary | ICD-10-CM | POA: Diagnosis not present

## 2017-09-05 DIAGNOSIS — E781 Pure hyperglyceridemia: Secondary | ICD-10-CM | POA: Diagnosis not present

## 2017-09-05 DIAGNOSIS — E1165 Type 2 diabetes mellitus with hyperglycemia: Secondary | ICD-10-CM | POA: Diagnosis not present

## 2017-09-05 DIAGNOSIS — I10 Essential (primary) hypertension: Secondary | ICD-10-CM | POA: Diagnosis not present

## 2017-11-01 DIAGNOSIS — E1165 Type 2 diabetes mellitus with hyperglycemia: Secondary | ICD-10-CM | POA: Diagnosis not present

## 2017-11-01 DIAGNOSIS — R739 Hyperglycemia, unspecified: Secondary | ICD-10-CM | POA: Diagnosis not present

## 2017-12-03 DIAGNOSIS — R946 Abnormal results of thyroid function studies: Secondary | ICD-10-CM | POA: Diagnosis not present

## 2017-12-03 DIAGNOSIS — E782 Mixed hyperlipidemia: Secondary | ICD-10-CM | POA: Diagnosis not present

## 2017-12-03 DIAGNOSIS — E1165 Type 2 diabetes mellitus with hyperglycemia: Secondary | ICD-10-CM | POA: Diagnosis not present

## 2017-12-04 DIAGNOSIS — I10 Essential (primary) hypertension: Secondary | ICD-10-CM | POA: Diagnosis not present

## 2017-12-06 DIAGNOSIS — E1165 Type 2 diabetes mellitus with hyperglycemia: Secondary | ICD-10-CM | POA: Diagnosis not present

## 2017-12-06 DIAGNOSIS — I10 Essential (primary) hypertension: Secondary | ICD-10-CM | POA: Diagnosis not present

## 2017-12-06 DIAGNOSIS — E875 Hyperkalemia: Secondary | ICD-10-CM | POA: Diagnosis not present

## 2018-01-16 DIAGNOSIS — J019 Acute sinusitis, unspecified: Secondary | ICD-10-CM | POA: Diagnosis not present

## 2018-03-06 ENCOUNTER — Ambulatory Visit (INDEPENDENT_AMBULATORY_CARE_PROVIDER_SITE_OTHER): Payer: Self-pay | Admitting: Otolaryngology

## 2018-03-27 DIAGNOSIS — J0100 Acute maxillary sinusitis, unspecified: Secondary | ICD-10-CM | POA: Diagnosis not present

## 2018-03-27 DIAGNOSIS — R0982 Postnasal drip: Secondary | ICD-10-CM | POA: Diagnosis not present

## 2018-03-27 DIAGNOSIS — R05 Cough: Secondary | ICD-10-CM | POA: Diagnosis not present

## 2018-04-04 DIAGNOSIS — R739 Hyperglycemia, unspecified: Secondary | ICD-10-CM | POA: Diagnosis not present

## 2018-04-04 DIAGNOSIS — I10 Essential (primary) hypertension: Secondary | ICD-10-CM | POA: Diagnosis not present

## 2018-04-04 DIAGNOSIS — E1165 Type 2 diabetes mellitus with hyperglycemia: Secondary | ICD-10-CM | POA: Diagnosis not present

## 2018-04-04 DIAGNOSIS — E781 Pure hyperglyceridemia: Secondary | ICD-10-CM | POA: Diagnosis not present

## 2018-04-04 DIAGNOSIS — H9202 Otalgia, left ear: Secondary | ICD-10-CM | POA: Diagnosis not present

## 2018-04-09 DIAGNOSIS — E1165 Type 2 diabetes mellitus with hyperglycemia: Secondary | ICD-10-CM | POA: Diagnosis not present

## 2018-04-09 DIAGNOSIS — I10 Essential (primary) hypertension: Secondary | ICD-10-CM | POA: Diagnosis not present

## 2018-04-09 DIAGNOSIS — E782 Mixed hyperlipidemia: Secondary | ICD-10-CM | POA: Diagnosis not present

## 2018-04-11 DIAGNOSIS — Z9641 Presence of insulin pump (external) (internal): Secondary | ICD-10-CM | POA: Diagnosis not present

## 2018-07-03 ENCOUNTER — Ambulatory Visit (INDEPENDENT_AMBULATORY_CARE_PROVIDER_SITE_OTHER): Payer: 59 | Admitting: Otolaryngology

## 2018-07-03 DIAGNOSIS — H9209 Otalgia, unspecified ear: Secondary | ICD-10-CM

## 2018-07-03 DIAGNOSIS — H6983 Other specified disorders of Eustachian tube, bilateral: Secondary | ICD-10-CM

## 2018-07-03 DIAGNOSIS — H903 Sensorineural hearing loss, bilateral: Secondary | ICD-10-CM

## 2018-08-18 DIAGNOSIS — I10 Essential (primary) hypertension: Secondary | ICD-10-CM | POA: Diagnosis not present

## 2018-08-18 DIAGNOSIS — E1165 Type 2 diabetes mellitus with hyperglycemia: Secondary | ICD-10-CM | POA: Diagnosis not present

## 2018-08-21 DIAGNOSIS — I10 Essential (primary) hypertension: Secondary | ICD-10-CM | POA: Diagnosis not present

## 2018-08-21 DIAGNOSIS — E782 Mixed hyperlipidemia: Secondary | ICD-10-CM | POA: Diagnosis not present

## 2018-08-21 DIAGNOSIS — E1165 Type 2 diabetes mellitus with hyperglycemia: Secondary | ICD-10-CM | POA: Diagnosis not present

## 2018-10-27 ENCOUNTER — Other Ambulatory Visit: Payer: Self-pay | Admitting: *Deleted

## 2019-09-11 ENCOUNTER — Ambulatory Visit: Payer: 59 | Admitting: Cardiology

## 2019-09-11 ENCOUNTER — Encounter: Payer: Self-pay | Admitting: Cardiology

## 2019-09-11 ENCOUNTER — Other Ambulatory Visit: Payer: Self-pay

## 2019-09-11 VITALS — BP 120/78 | HR 81 | Ht 63.5 in | Wt 272.0 lb

## 2019-09-11 DIAGNOSIS — R079 Chest pain, unspecified: Secondary | ICD-10-CM

## 2019-09-11 DIAGNOSIS — I10 Essential (primary) hypertension: Secondary | ICD-10-CM

## 2019-09-11 DIAGNOSIS — R0602 Shortness of breath: Secondary | ICD-10-CM

## 2019-09-11 DIAGNOSIS — Z87898 Personal history of other specified conditions: Secondary | ICD-10-CM | POA: Diagnosis not present

## 2019-09-11 DIAGNOSIS — E782 Mixed hyperlipidemia: Secondary | ICD-10-CM

## 2019-09-11 NOTE — Progress Notes (Signed)
Cardiology Office Note  Date: 09/11/2019   ID: CEDAR GIGLIO, DOB [birth year], MRN [id number]  PCP:  Celene Squibb, MD  Consulting Cardiologist:  Rozann Lesches, MD Electrophysiologist:  None   Chief Complaint  Patient presents with  . Chest Pain    History of Present Illness: Loretta Luna is a 49 y.o. female referred for cardiology consultation by Dr. Nevada Crane for the evaluation of chest pain.  She states that she has had intermittent episodes of left-sided chest discomfort, points to an area in her sternum wrapping around below her breast.  This has been going on for quite some time.  About a month ago however she had a more intense episode associated with shortness of breath, prolonged at rest.  She did take an aspirin, had recurrent symptoms later that day.  She took aspirin a day for about 2 weeks, and states that she has not had any recurrence recently.  She underwent previous cardiac evaluation with Dr. Melvern Banker.  Cardiac catheterization from 2002 (following an abnormal Persantine Cardiolite) reported normal coronary arteries and LVEF.  Medical history is outlined below.  We went over her medications.  She is on of a septa and Crestor for treatment of mixed hyperlipidemia that is predominantly hypertriglyceridemia.  Also stable diabetic regimen.  I reviewed her most recent lab work.  Total cholesterol was only 142.  Triglycerides 882, but she states that these were as high as 3500 years ago.  She has a shellfish and iodine allergy.  I personally reviewed her ECG today which shows normal sinus rhythm with low voltage in the precordial leads.  Past Medical History:  Diagnosis Date  . Anxiety   . Essential hypertension   . GERD (gastroesophageal reflux disease)   . Insomnia   . Mixed hyperlipidemia   . Obesity   . Pancreatic cyst   . Type 2 diabetes mellitus ([place])     Past Surgical History:  Procedure Laterality Date  . CHOLECYSTECTOMY      Current Outpatient Medications    Medication Sig Dispense Refill  . aspirin EC 81 MG tablet Take 81 mg by mouth daily.    . Azilsartan-Chlorthalidone (EDARBYCLOR) 40-25 MG TABS Take by mouth daily.    . Bempedoic Acid-Ezetimibe (NEXLIZET) 180-10 MG TABS Take by mouth.    . furosemide (LASIX) 20 MG tablet Take 20 mg by mouth daily.    Marland Kitchen icosapent Ethyl (VASCEPA) 1 g capsule Take 2 g by mouth 2 (two) times daily.    . Insulin Regular Human (AFREZZA IN) Inhale into the lungs.    . Liraglutide (VICTOZA) 18 MG/3ML SOLN Inject into the skin.    . Magnesium 250 MG TABS Take by mouth.    . metFORMIN (GLUCOPHAGE) 500 MG tablet Take 500 mg by mouth 2 (two) times daily with a meal.      . omega-3 acid ethyl esters (LOVAZA) 1 G capsule Take 1 g by mouth 2 (two) times daily.    . potassium chloride (KLOR-CON) 10 MEQ tablet Take 10 mEq by mouth 2 (two) times daily.    . rosuvastatin (CRESTOR) 20 MG tablet Take 20 mg by mouth daily.    . TRESIBA FLEXTOUCH 200 UNIT/ML FlexTouch Pen 100 Units.     Marland Kitchen VITAMIN D PO Take by mouth.    . fenofibrate micronized (ANTARA) 130 MG capsule Take 130 mg by mouth daily before breakfast.     No current facility-administered medications for this visit.   Allergies:  Food,  Iodine, and Metformin and related   Social History: The patient  reports that she has never smoked. She has never used smokeless tobacco. She reports that she does not drink alcohol.   Family History: The patient's family history includes Asthma in her brother; COPD in her maternal grandfather and mother; Cancer in her maternal grandmother and paternal grandmother; Hyperlipidemia in her brother, father, maternal grandfather, and mother; Hypertension in her brother, father, mother, and sister; Stroke in her father, mother, and paternal grandfather.   ROS:  No palpitations or syncope.  Physical Exam: VS:  BP 120/78   Pulse 81   Ht 5' 3.5" (1.613 m)   Wt 272 lb (123.4 kg)   SpO2 98%   BMI 47.43 kg/m , BMI Body mass index is 47.43  kg/m.  Wt Readings from Last 3 Encounters:  09/11/19 272 lb (123.4 kg)  03/04/12 260 lb (117.9 kg)  09/10/11 271 lb 3.2 oz (123 kg)    General: Obese woman, appears comfortable at rest. HEENT: Conjunctiva and lids normal, wearing a mask. Neck: Supple, no elevated JVP or carotid bruits, no thyromegaly. Lungs: Clear to auscultation, nonlabored breathing at rest. Cardiac: Regular rate and rhythm, no S3 or significant systolic murmur. Abdomen: Protuberant, nontender, bowel sounds present. Extremities: No pitting edema, distal pulses 2+. Skin: Warm and dry. Musculoskeletal: No kyphosis. Neuropsychiatric: Alert and oriented x3, affect grossly appropriate.  ECG:  No old tracings for review today.  Recent Labwork:  February 2021: Hemoglobin 14.2, platelets 285, BUN 13, creatinine 0.7, potassium 3.8, AST 27, ALT 28, cholesterol 142, triglycerides 882, HDL 39, LDL not calculated, hemoglobin A1c 9.4%, TSH 4.58  Other Studies Reviewed Today:  Cardiac catheterization 03/28/2001 (Dr. Melvern Banker): ANGIOGRAPHIC RESULTS:  The left main coronary artery was normal.  The left anterior descending coronary artery gave rise to one major diagonal branch and neither the LAD nor the diagonal showed lesions.  The circumflex was a dominant vessel giving rise to two obtuse marginal branches and a large posterior descending branch.  The right coronary artery was a medium-size vessel with no lesions.  LEFT VENTRICULOGRAPHY:  The left ventricular angiography shows normal contractility.  Ejection fraction was normal and estimated at 60%.  No mitral regurgitation was seen.  FINAL DIAGNOSES: 1. Normal coronary arteries. 2. Normal left ventricular systolic function.  Assessment and Plan:  1.  Recurrent chest pain, most significant episode occurred about a month ago as discussed above.  Cardiac risk factors include obesity, type 2 diabetes mellitus, mixed hyperlipidemia, and hypertension.  She has a  history of normal coronary arteries by cardiac catheterization in 2002, no recent ischemic evaluation.  ECG reviewed and nonspecific.  We will plan a follow-up echocardiogram as well as a 2-day protocol Lexiscan Myoview for assessment of cardiac structure and function as well as ischemia.  2.  Mixed hyperlipidemia, predominantly hypertriglyceridemia.  She is on Crestor, fenofibrate and Vascepa.  Recent total cholesterol only 142 and triglycerides 882.  Weight loss and better control of type 2 diabetes mellitus would be beneficial.  Could consider referring her to the lipid clinic if triglycerides do not come down further.  3.  Essential hypertension by history, blood pressure is normal today.  Medication Adjustments/Labs and Tests Ordered: Current medicines are reviewed at length with the patient today.  Concerns regarding medicines are outlined above.   Tests Ordered: Orders Placed This Encounter  Procedures  . NM Myocar Multi W/Spect W/Wall Motion / EF  . EKG 12-Lead  . ECHOCARDIOGRAM COMPLETE  Medication Changes: No orders of the defined types were placed in this encounter.   Disposition:  Follow up test results.  Signed, Satira Sark, MD, [facility] 09/11/2019 3:14 PM    [place] at [facility] [address], [place], [place] [address] Phone: [phone]; Fax: [phone]

## 2019-09-11 NOTE — Patient Instructions (Signed)
Medication Instructions:  Your physician recommends that you continue on your current medications as directed. Please refer to the Current Medication list given to you today.  *If you need a refill on your cardiac medications before your next appointment, please call your pharmacy*   Lab Work: None today If you have labs (blood work) drawn today and your tests are completely normal, you will receive your results only by: Marland Kitchen MyChart Message (if you have MyChart) OR . A paper copy in the mail If you have any lab test that is abnormal or we need to change your treatment, we will call you to review the results.   Testing/Procedures: Your physician has requested that you have an echocardiogram. Echocardiography is a painless test that uses sound waves to create images of your heart. It provides your doctor with information about the size and shape of your heart and how well your heart's chambers and valves are working. This procedure takes approximately one hour. There are no restrictions for this procedure.    Your physician has requested that you have a lexiscan myoview, 2 day study. For further information please visit [email or web address]. Please follow instruction sheet, as given.     Follow-Up: At [facility], you and your health needs are our priority.  As part of our continuing mission to provide you with exceptional heart care, we have created designated Provider Care Teams.  These Care Teams include your primary Cardiologist (physician) and Advanced Practice Providers (APPs -  Physician Assistants and Nurse Practitioners) who all work together to provide you with the care you need, when you need it.  We recommend signing up for the patient portal called "MyChart".  Sign up information is provided on this After Visit Summary.  MyChart is used to connect with patients for Virtual Visits (Telemedicine).  Patients are able to view lab/test results, encounter notes, upcoming appointments,  etc.  Non-urgent messages can be sent to your provider as well.   To learn more about what you can do with MyChart, go to [email or web address].    Your next appointment:  We will call you with results.

## 2019-09-30 ENCOUNTER — Encounter (HOSPITAL_COMMUNITY): Payer: 59

## 2019-09-30 ENCOUNTER — Ambulatory Visit (HOSPITAL_COMMUNITY): Admission: RE | Admit: 2019-09-30 | Payer: 59 | Source: Ambulatory Visit

## 2019-09-30 ENCOUNTER — Ambulatory Visit (HOSPITAL_COMMUNITY): Payer: 59

## 2020-03-17 ENCOUNTER — Other Ambulatory Visit (HOSPITAL_COMMUNITY): Payer: Self-pay | Admitting: Internal Medicine

## 2020-03-17 DIAGNOSIS — Z1231 Encounter for screening mammogram for malignant neoplasm of breast: Secondary | ICD-10-CM

## 2020-03-21 ENCOUNTER — Ambulatory Visit (HOSPITAL_COMMUNITY)
Admission: RE | Admit: 2020-03-21 | Discharge: 2020-03-21 | Disposition: A | Discharge status: Home / Self Care | Payer: 59 | Source: Ambulatory Visit | Attending: Internal Medicine | Admitting: Internal Medicine

## 2020-03-21 ENCOUNTER — Other Ambulatory Visit: Payer: Self-pay

## 2020-03-21 DIAGNOSIS — Z1231 Encounter for screening mammogram for malignant neoplasm of breast: Secondary | ICD-10-CM | POA: Diagnosis present

## 2021-03-16 DIAGNOSIS — I10 Essential (primary) hypertension: Secondary | ICD-10-CM | POA: Diagnosis not present

## 2021-03-16 DIAGNOSIS — E1165 Type 2 diabetes mellitus with hyperglycemia: Secondary | ICD-10-CM | POA: Diagnosis not present

## 2021-03-24 DIAGNOSIS — E282 Polycystic ovarian syndrome: Secondary | ICD-10-CM | POA: Diagnosis not present

## 2021-03-24 DIAGNOSIS — E1165 Type 2 diabetes mellitus with hyperglycemia: Secondary | ICD-10-CM | POA: Diagnosis not present

## 2021-03-24 DIAGNOSIS — I10 Essential (primary) hypertension: Secondary | ICD-10-CM | POA: Diagnosis not present

## 2021-03-24 DIAGNOSIS — K862 Cyst of pancreas: Secondary | ICD-10-CM | POA: Diagnosis not present

## 2021-03-30 DIAGNOSIS — I10 Essential (primary) hypertension: Secondary | ICD-10-CM | POA: Diagnosis not present

## 2021-03-30 DIAGNOSIS — Z9049 Acquired absence of other specified parts of digestive tract: Secondary | ICD-10-CM | POA: Diagnosis not present

## 2021-03-30 DIAGNOSIS — E876 Hypokalemia: Secondary | ICD-10-CM | POA: Diagnosis not present

## 2021-03-30 DIAGNOSIS — Z91048 Other nonmedicinal substance allergy status: Secondary | ICD-10-CM | POA: Diagnosis not present

## 2021-03-30 DIAGNOSIS — Z91040 Latex allergy status: Secondary | ICD-10-CM | POA: Diagnosis not present

## 2021-03-30 DIAGNOSIS — E785 Hyperlipidemia, unspecified: Secondary | ICD-10-CM | POA: Diagnosis not present

## 2021-03-30 DIAGNOSIS — R000 Tachycardia, unspecified: Secondary | ICD-10-CM | POA: Diagnosis not present

## 2021-03-30 DIAGNOSIS — Z794 Long term (current) use of insulin: Secondary | ICD-10-CM | POA: Diagnosis not present

## 2021-03-30 DIAGNOSIS — A419 Sepsis, unspecified organism: Secondary | ICD-10-CM | POA: Diagnosis not present

## 2021-03-30 DIAGNOSIS — R509 Fever, unspecified: Secondary | ICD-10-CM | POA: Diagnosis not present

## 2021-03-30 DIAGNOSIS — R42 Dizziness and giddiness: Secondary | ICD-10-CM | POA: Diagnosis not present

## 2021-03-30 DIAGNOSIS — J189 Pneumonia, unspecified organism: Secondary | ICD-10-CM | POA: Diagnosis not present

## 2021-03-30 DIAGNOSIS — E119 Type 2 diabetes mellitus without complications: Secondary | ICD-10-CM | POA: Diagnosis not present

## 2021-03-30 DIAGNOSIS — R918 Other nonspecific abnormal finding of lung field: Secondary | ICD-10-CM | POA: Diagnosis not present

## 2021-03-30 DIAGNOSIS — Z20822 Contact with and (suspected) exposure to covid-19: Secondary | ICD-10-CM | POA: Diagnosis not present

## 2021-03-30 DIAGNOSIS — R6883 Chills (without fever): Secondary | ICD-10-CM | POA: Diagnosis not present

## 2021-03-31 DIAGNOSIS — A419 Sepsis, unspecified organism: Secondary | ICD-10-CM | POA: Diagnosis not present

## 2021-03-31 DIAGNOSIS — E119 Type 2 diabetes mellitus without complications: Secondary | ICD-10-CM | POA: Diagnosis not present

## 2021-03-31 DIAGNOSIS — E1165 Type 2 diabetes mellitus with hyperglycemia: Secondary | ICD-10-CM | POA: Diagnosis not present

## 2021-03-31 DIAGNOSIS — I10 Essential (primary) hypertension: Secondary | ICD-10-CM | POA: Diagnosis not present

## 2021-03-31 DIAGNOSIS — J189 Pneumonia, unspecified organism: Secondary | ICD-10-CM | POA: Diagnosis not present

## 2021-04-01 DIAGNOSIS — J189 Pneumonia, unspecified organism: Secondary | ICD-10-CM | POA: Diagnosis not present

## 2021-04-01 DIAGNOSIS — E119 Type 2 diabetes mellitus without complications: Secondary | ICD-10-CM | POA: Diagnosis not present

## 2021-04-01 DIAGNOSIS — A419 Sepsis, unspecified organism: Secondary | ICD-10-CM | POA: Diagnosis not present

## 2021-04-01 DIAGNOSIS — I10 Essential (primary) hypertension: Secondary | ICD-10-CM | POA: Diagnosis not present

## 2021-04-12 DIAGNOSIS — Z23 Encounter for immunization: Secondary | ICD-10-CM | POA: Diagnosis not present

## 2021-04-12 DIAGNOSIS — E1165 Type 2 diabetes mellitus with hyperglycemia: Secondary | ICD-10-CM | POA: Diagnosis not present

## 2021-04-12 DIAGNOSIS — E282 Polycystic ovarian syndrome: Secondary | ICD-10-CM | POA: Diagnosis not present

## 2021-04-12 DIAGNOSIS — I10 Essential (primary) hypertension: Secondary | ICD-10-CM | POA: Diagnosis not present

## 2021-04-12 DIAGNOSIS — K862 Cyst of pancreas: Secondary | ICD-10-CM | POA: Diagnosis not present

## 2021-04-13 ENCOUNTER — Other Ambulatory Visit (HOSPITAL_COMMUNITY): Payer: Self-pay | Admitting: Internal Medicine

## 2021-04-24 ENCOUNTER — Other Ambulatory Visit: Payer: Self-pay

## 2021-04-24 ENCOUNTER — Ambulatory Visit (HOSPITAL_COMMUNITY)
Admission: RE | Admit: 2021-04-24 | Discharge: 2021-04-24 | Disposition: A | Discharge status: Home / Self Care | Payer: Self-pay | Source: Ambulatory Visit | Attending: Internal Medicine | Admitting: Internal Medicine

## 2021-04-24 DIAGNOSIS — E7849 Other hyperlipidemia: Secondary | ICD-10-CM | POA: Insufficient documentation

## 2021-04-26 DIAGNOSIS — E1165 Type 2 diabetes mellitus with hyperglycemia: Secondary | ICD-10-CM | POA: Diagnosis not present

## 2021-04-26 DIAGNOSIS — E782 Mixed hyperlipidemia: Secondary | ICD-10-CM | POA: Diagnosis not present

## 2021-07-10 DIAGNOSIS — E1165 Type 2 diabetes mellitus with hyperglycemia: Secondary | ICD-10-CM | POA: Diagnosis not present

## 2021-07-10 DIAGNOSIS — E7849 Other hyperlipidemia: Secondary | ICD-10-CM | POA: Diagnosis not present

## 2021-07-13 DIAGNOSIS — E282 Polycystic ovarian syndrome: Secondary | ICD-10-CM | POA: Diagnosis not present

## 2021-07-13 DIAGNOSIS — I10 Essential (primary) hypertension: Secondary | ICD-10-CM | POA: Diagnosis not present

## 2021-07-13 DIAGNOSIS — K862 Cyst of pancreas: Secondary | ICD-10-CM | POA: Diagnosis not present

## 2021-07-13 DIAGNOSIS — E1165 Type 2 diabetes mellitus with hyperglycemia: Secondary | ICD-10-CM | POA: Diagnosis not present

## 2021-08-02 ENCOUNTER — Encounter: Payer: Self-pay | Admitting: Internal Medicine

## 2021-09-11 DIAGNOSIS — R197 Diarrhea, unspecified: Secondary | ICD-10-CM | POA: Diagnosis not present

## 2021-09-19 DIAGNOSIS — R160 Hepatomegaly, not elsewhere classified: Secondary | ICD-10-CM | POA: Diagnosis not present

## 2021-09-19 DIAGNOSIS — K862 Cyst of pancreas: Secondary | ICD-10-CM | POA: Diagnosis not present

## 2021-09-19 DIAGNOSIS — N838 Other noninflammatory disorders of ovary, fallopian tube and broad ligament: Secondary | ICD-10-CM | POA: Diagnosis not present

## 2021-09-19 DIAGNOSIS — K760 Fatty (change of) liver, not elsewhere classified: Secondary | ICD-10-CM | POA: Diagnosis not present

## 2021-10-19 DIAGNOSIS — E7849 Other hyperlipidemia: Secondary | ICD-10-CM | POA: Diagnosis not present

## 2021-10-19 DIAGNOSIS — E1165 Type 2 diabetes mellitus with hyperglycemia: Secondary | ICD-10-CM | POA: Diagnosis not present

## 2021-10-26 DIAGNOSIS — K862 Cyst of pancreas: Secondary | ICD-10-CM | POA: Diagnosis not present

## 2021-10-26 DIAGNOSIS — I10 Essential (primary) hypertension: Secondary | ICD-10-CM | POA: Diagnosis not present

## 2021-10-26 DIAGNOSIS — E1165 Type 2 diabetes mellitus with hyperglycemia: Secondary | ICD-10-CM | POA: Diagnosis not present

## 2021-10-26 DIAGNOSIS — E282 Polycystic ovarian syndrome: Secondary | ICD-10-CM | POA: Diagnosis not present

## 2021-12-07 DIAGNOSIS — I10 Essential (primary) hypertension: Secondary | ICD-10-CM | POA: Diagnosis not present

## 2021-12-07 DIAGNOSIS — E1165 Type 2 diabetes mellitus with hyperglycemia: Secondary | ICD-10-CM | POA: Diagnosis not present

## 2021-12-07 DIAGNOSIS — E282 Polycystic ovarian syndrome: Secondary | ICD-10-CM | POA: Diagnosis not present

## 2021-12-07 DIAGNOSIS — K862 Cyst of pancreas: Secondary | ICD-10-CM | POA: Diagnosis not present

## 2021-12-08 ENCOUNTER — Other Ambulatory Visit (HOSPITAL_COMMUNITY): Payer: Self-pay | Admitting: Internal Medicine

## 2021-12-08 DIAGNOSIS — R011 Cardiac murmur, unspecified: Secondary | ICD-10-CM

## 2021-12-08 DIAGNOSIS — Z1231 Encounter for screening mammogram for malignant neoplasm of breast: Secondary | ICD-10-CM

## 2021-12-14 ENCOUNTER — Ambulatory Visit (HOSPITAL_COMMUNITY)
Admission: RE | Admit: 2021-12-14 | Discharge: 2021-12-14 | Disposition: A | Discharge status: Home / Self Care | Payer: BC Managed Care – PPO | Source: Ambulatory Visit | Attending: Internal Medicine | Admitting: Internal Medicine

## 2021-12-14 DIAGNOSIS — Z1231 Encounter for screening mammogram for malignant neoplasm of breast: Secondary | ICD-10-CM | POA: Diagnosis not present

## 2021-12-14 DIAGNOSIS — R011 Cardiac murmur, unspecified: Secondary | ICD-10-CM | POA: Diagnosis not present

## 2021-12-14 LAB — ECHOCARDIOGRAM COMPLETE
Area-P 1/2: 2.83 cm2
S' Lateral: 2.6 cm

## 2021-12-14 NOTE — Progress Notes (Signed)
*  PRELIMINARY RESULTS* Echocardiogram 2D Echocardiogram has been performed.  Loretta Luna 12/14/2021, 3:49 PM

## 2022-02-01 DIAGNOSIS — E1165 Type 2 diabetes mellitus with hyperglycemia: Secondary | ICD-10-CM | POA: Diagnosis not present

## 2022-02-01 DIAGNOSIS — E7849 Other hyperlipidemia: Secondary | ICD-10-CM | POA: Diagnosis not present

## 2022-02-08 DIAGNOSIS — E282 Polycystic ovarian syndrome: Secondary | ICD-10-CM | POA: Diagnosis not present

## 2022-02-08 DIAGNOSIS — K862 Cyst of pancreas: Secondary | ICD-10-CM | POA: Diagnosis not present

## 2022-02-08 DIAGNOSIS — I10 Essential (primary) hypertension: Secondary | ICD-10-CM | POA: Diagnosis not present

## 2022-02-08 DIAGNOSIS — E1165 Type 2 diabetes mellitus with hyperglycemia: Secondary | ICD-10-CM | POA: Diagnosis not present

## 2022-03-20 DIAGNOSIS — R0981 Nasal congestion: Secondary | ICD-10-CM | POA: Diagnosis not present

## 2022-03-20 DIAGNOSIS — L814 Other melanin hyperpigmentation: Secondary | ICD-10-CM | POA: Diagnosis not present

## 2022-03-20 DIAGNOSIS — L821 Other seborrheic keratosis: Secondary | ICD-10-CM | POA: Diagnosis not present

## 2022-03-20 DIAGNOSIS — E1165 Type 2 diabetes mellitus with hyperglycemia: Secondary | ICD-10-CM | POA: Diagnosis not present

## 2022-03-20 DIAGNOSIS — D485 Neoplasm of uncertain behavior of skin: Secondary | ICD-10-CM | POA: Diagnosis not present

## 2022-03-20 DIAGNOSIS — D2361 Other benign neoplasm of skin of right upper limb, including shoulder: Secondary | ICD-10-CM | POA: Diagnosis not present

## 2022-03-20 DIAGNOSIS — L723 Sebaceous cyst: Secondary | ICD-10-CM | POA: Diagnosis not present

## 2022-03-20 DIAGNOSIS — L738 Other specified follicular disorders: Secondary | ICD-10-CM | POA: Diagnosis not present

## 2022-04-20 ENCOUNTER — Ambulatory Visit (HOSPITAL_BASED_OUTPATIENT_CLINIC_OR_DEPARTMENT_OTHER): Payer: BC Managed Care – PPO | Admitting: Internal Medicine

## 2022-04-20 ENCOUNTER — Encounter (HOSPITAL_BASED_OUTPATIENT_CLINIC_OR_DEPARTMENT_OTHER): Payer: Self-pay | Admitting: Internal Medicine

## 2022-04-20 VITALS — BP 120/81 | HR 78 | Ht 63.5 in | Wt 265.4 lb

## 2022-04-20 DIAGNOSIS — E7849 Other hyperlipidemia: Secondary | ICD-10-CM | POA: Diagnosis not present

## 2022-04-20 DIAGNOSIS — E119 Type 2 diabetes mellitus without complications: Secondary | ICD-10-CM

## 2022-04-20 DIAGNOSIS — I10 Essential (primary) hypertension: Secondary | ICD-10-CM | POA: Diagnosis not present

## 2022-04-20 DIAGNOSIS — E282 Polycystic ovarian syndrome: Secondary | ICD-10-CM

## 2022-04-20 DIAGNOSIS — E785 Hyperlipidemia, unspecified: Secondary | ICD-10-CM | POA: Diagnosis not present

## 2022-04-20 NOTE — Patient Instructions (Signed)
Medication Instructions:  NO CHANGES today   *If you need a refill on your cardiac medications before your next appointment, please call your pharmacy*   Lab Work: NMR lipoprofile, ApoB, LPa today   If you have labs (blood work) drawn today and your tests are completely normal, you will receive your results only by: [place] (if you have MyChart) OR A paper copy in the mail If you have any lab test that is abnormal or we need to change your treatment, we will call you to review the results.   Testing/Procedures: Genetic Test -- results available in 2-3 weeks    Follow-Up: At [facility], you and your health needs are our priority.  As part of our continuing mission to provide you with exceptional heart care, we have created designated Provider Care Teams.  These Care Teams include your primary Cardiologist (physician) and Advanced Practice Providers (APPs -  Physician Assistants and Nurse Practitioners) who all work together to provide you with the care you need, when you need it.  We recommend signing up for the patient portal called "MyChart".  Sign up information is provided on this After Visit Summary.  MyChart is used to connect with patients for Virtual Visits (Telemedicine).  Patients are able to view lab/test results, encounter notes, upcoming appointments, etc.  Non-urgent messages can be sent to your provider as well.   To learn more about what you can do with MyChart, go to [email or web address].    Your next appointment:   3-4 months with Dr. Debara Pickett -- lipid clinic

## 2022-04-20 NOTE — Progress Notes (Signed)
LIPID CLINIC CONSULT NOTE  Chief Complaint:  Manage dyslipidemia  Primary Care Physician: Celene Squibb, MD  Primary Cardiologist:  Rozann Lesches, MD  HPI:  Loretta Luna is a 51 y.o. female who is being seen today for the evaluation of dyslipidemia at the request of Celene Squibb, MD. this is a pleasant 51 year old female kindly referred for evaluation management of dyslipidemia.  Primarily she has had a history of very high triglycerides which she reports has been over 3000 in the past.  More recently lab work shows that her triglycerides have been in the 800s.  She has been primarily managed by her PCP who has her on quite a few cardiovascular/lipid-lowering medications, including Vascepa, rosuvastatin, Repatha and Nexlizet.  Her last labs in August showed total cholesterol 109, HDL 39, triglycerides 476 and LDL of 8, apparently then she started the Repatha subsequent to these labs.  She is not currently on a fibrate but reports having taken it in the past although is unclear why it was discontinued.  I did receive referral information from her primary care doctor.  She has been previously diagnosed with familial combined hyperlipidemia.  She did have a calcium score which reportedly showed no coronary calcium.  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  PMHx:  Past Medical History:  Diagnosis Date   Anxiety    Essential hypertension    GERD (gastroesophageal reflux disease)    Insomnia    Mixed hyperlipidemia    Obesity    Pancreatic cyst    Type 2 diabetes mellitus ([place])     Past Surgical History:  Procedure Laterality Date   CHOLECYSTECTOMY      FAMHx:  Family History  Problem Relation Age of Onset   COPD Mother    Hyperlipidemia Mother    Hypertension Mother    Stroke Mother    Hyperlipidemia Father    Hypertension Father    Stroke Father     Hypertension Sister    Asthma Brother    Hyperlipidemia Brother    Hypertension Brother    Cancer Maternal Grandmother    COPD Maternal Grandfather    Hyperlipidemia Maternal Grandfather    Cancer Paternal Grandmother    Stroke Paternal Grandfather     SOCHx:   reports that she has never smoked. She has never used smokeless tobacco. She reports that she does not drink alcohol. No history on file for drug use.  ALLERGIES:  Allergies  Allergen Reactions   Food     shellfish   Iodine     REACTION: swelling, hives, tachypnea   Metformin And Related Diarrhea    ROS: Pertinent items noted in HPI and remainder of comprehensive ROS otherwise negative.  HOME MEDS: Current Outpatient Medications on File Prior to Visit  Medication Sig Dispense Refill   aspirin EC 81 MG tablet Take 81 mg by mouth daily.     Bempedoic Acid-Ezetimibe (NEXLIZET) 180-10 MG TABS Take by mouth.     chlorthalidone (HYGROTON) 25 MG tablet Take 25 mg by mouth daily.     furosemide (LASIX) 20 MG tablet Take 20 mg by mouth daily.     glipiZIDE (GLUCOTROL XL) 5 MG 24 hr tablet Take 5 mg by mouth 2 (two) times daily.     icosapent Ethyl (VASCEPA) 1 g capsule Take 2 g by mouth 2 (two) times daily.  Magnesium 250 MG TABS Take 1 tablet by mouth 2 (two) times daily.     MOUNJARO 12.5 MG/0.5ML Pen SMARTSIG:0.5 Milliliter(s) SUB-Q Once a Week     olmesartan (BENICAR) 40 MG tablet Take 40 mg by mouth daily.     potassium chloride (KLOR-CON) 10 MEQ tablet Take 10 mEq by mouth 2 (two) times daily.     REPATHA SURECLICK 793 MG/ML SOAJ Inject 1 mL into the skin every 14 (fourteen) days.     rosuvastatin (CRESTOR) 20 MG tablet Take 20 mg by mouth daily.     TRESIBA FLEXTOUCH 200 UNIT/ML FlexTouch Pen 74-80 Units 2 (two) times daily.     VITAMIN D PO Take by mouth.     No current facility-administered medications on file prior to visit.    LABS/IMAGING: No results found for this or any previous visit (from the past 48  hour(s)). No results found.  LIPID PANEL:    Component Value Date/Time   CHOL 210 (H) 03/28/2007 2157   TRIG 512 (H) 03/28/2007 2157   HDL 45 03/28/2007 2157   CHOLHDL 4.7 Ratio 03/28/2007 2157   VLDL NOT CALC mg/dL 03/28/2007 2157   [place] See Comment mg/dL 03/28/2007 2157    WEIGHTS: Wt Readings from Last 3 Encounters:  04/20/22 265 lb 6.4 oz (120.4 kg)  09/11/19 272 lb (123.4 kg)  03/04/12 260 lb (117.9 kg)    VITALS: BP 120/81 (BP Location: Left Arm, Patient Position: Sitting, Cuff Size: Large)   Pulse 78   Ht 5' 3.5" (1.613 m)   Wt 265 lb 6.4 oz (120.4 kg)   SpO2 97%   BMI 46.28 kg/m   EXAM: Deferred  EKG: Deferred  ASSESSMENT: 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  It is not clear how high her LDL cholesterol has been although she is on quite a bit of medication to lower that.  Recently Repatha was added however her LDL has been quite low, in fact was in the single digits in August.  PCSK9 inhibitors are not particularly potent medications at lowering triglycerides therefore I would not expect much more benefit with this however it would likely decimated her LDL.  Since she has been on it for about 2 months, I would like to repeat her lipids today.  We will check an NMR, APO B and LP(a) since I believe this was not assessed.  In addition she is a good candidate for genetic testing.  This will help [place] better sort out what I suspect is a genetic dyslipidemia.  She may be a candidate for one of our triglyceride lowering clinical trials looking at the APO C3 inhibitor.  She had previously been on a fibrate per her report but is no longer on that and that may be an appropriate medication to consider in addition  to her current therapies if her triglycerides remain high.  Thanks for this interesting referral.  Pixie Casino, MD, FACC, [place] Director of the [facility]  [facility] Diplomate of the American Board of Clinical Lipidology Attending Cardiologist  Direct Dial: [phone]  Fax: [phone]  Website:  [email or web address] 04/20/2022, 1:02 PM

## 2022-04-24 LAB — NMR, LIPOPROFILE
Cholesterol, Total: 82 mg/dL — ABNORMAL LOW (ref 100–199)
HDL Particle Number: 39.8 umol/L (ref >=30.5)
HDL-C: 44 mg/dL (ref >39)
LDL Particle Number: <300 nmol/L (ref <1000)
LP-IR Score: 71 — ABNORMAL HIGH (ref <=45)
Small LDL Particle Number: <90 nmol/L (ref <=527)
Triglycerides: 277 mg/dL — ABNORMAL HIGH (ref 0–149)

## 2022-04-24 LAB — LIPOPROTEIN A (LPA): Lipoprotein (a): <8.4 nmol/L (ref <75.0)

## 2022-04-24 LAB — APOLIPOPROTEIN B: Apolipoprotein B: 20 mg/dL (ref <90)

## 2022-05-02 ENCOUNTER — Telehealth: Payer: Self-pay | Admitting: Internal Medicine

## 2022-05-02 DIAGNOSIS — E7849 Other hyperlipidemia: Secondary | ICD-10-CM

## 2022-05-02 NOTE — Telephone Encounter (Signed)
Pt returning nurses call regarding results. Please advise 

## 2022-05-02 NOTE — Telephone Encounter (Signed)
Returned call to patient and provided the following recommendations. Labs mailed to the patient. Repatha removed from medication list.     "Overall cholesterol is extremely low - she is being overtreated. Would stop her Repatha.  Trigs are better at 277. LP(a) is negative. Repeat NMR in 2 months.   Dr. Lemmie Evens"

## 2022-05-17 ENCOUNTER — Encounter: Payer: Self-pay | Admitting: Cardiovascular Disease

## 2022-05-22 DIAGNOSIS — E282 Polycystic ovarian syndrome: Secondary | ICD-10-CM | POA: Diagnosis not present

## 2022-05-22 DIAGNOSIS — E1165 Type 2 diabetes mellitus with hyperglycemia: Secondary | ICD-10-CM | POA: Diagnosis not present

## 2022-06-06 ENCOUNTER — Encounter (INDEPENDENT_AMBULATORY_CARE_PROVIDER_SITE_OTHER): Payer: BC Managed Care – PPO | Admitting: Family Medicine

## 2022-06-06 DIAGNOSIS — E1165 Type 2 diabetes mellitus with hyperglycemia: Secondary | ICD-10-CM | POA: Diagnosis not present

## 2022-06-06 DIAGNOSIS — Z0289 Encounter for other administrative examinations: Secondary | ICD-10-CM

## 2022-06-06 LAB — HEPATIC FUNCTION PANEL
ALT: 29 U/L (ref 7–35)
AST: 33 (ref 13–35)
Alkaline Phosphatase: 100 (ref 25–125)
Bilirubin, Total: 0.4

## 2022-06-06 LAB — PROTEIN / CREATININE RATIO, URINE
Albumin, U: 41.4
Creatinine, Urine: 88.2

## 2022-06-06 LAB — BASIC METABOLIC PANEL
BUN: 12 (ref 4–21)
Chloride: 98 — AB (ref 99–108)
Creatinine: 0.7 (ref 0.5–1.1)
Glucose: 187
Potassium: 3.9 mEq/L (ref 3.5–5.1)
Sodium: 140 (ref 137–147)

## 2022-06-06 LAB — CBC: RBC: 5.81 — AB (ref 3.87–5.11)

## 2022-06-06 LAB — COMPREHENSIVE METABOLIC PANEL
Albumin: 4.6 (ref 3.5–5.0)
Calcium: 10.4 (ref 8.7–10.7)
eGFR: 100

## 2022-06-06 LAB — CBC AND DIFFERENTIAL
HCT: 49 — AB (ref 36–46)
Hemoglobin: 15.8 (ref 12.0–16.0)
Platelets: 317 10*3/uL (ref 150–400)
WBC: 7.2

## 2022-06-06 LAB — MICROALBUMIN / CREATININE URINE RATIO: Microalb Creat Ratio: 47

## 2022-06-06 LAB — HEMOGLOBIN A1C: Hemoglobin A1C: 8.6

## 2022-06-10 ENCOUNTER — Encounter (HOSPITAL_BASED_OUTPATIENT_CLINIC_OR_DEPARTMENT_OTHER): Payer: Self-pay | Admitting: Internal Medicine

## 2022-06-12 DIAGNOSIS — K862 Cyst of pancreas: Secondary | ICD-10-CM | POA: Diagnosis not present

## 2022-06-12 DIAGNOSIS — E282 Polycystic ovarian syndrome: Secondary | ICD-10-CM | POA: Diagnosis not present

## 2022-06-12 DIAGNOSIS — I10 Essential (primary) hypertension: Secondary | ICD-10-CM | POA: Diagnosis not present

## 2022-06-12 DIAGNOSIS — Z23 Encounter for immunization: Secondary | ICD-10-CM | POA: Diagnosis not present

## 2022-06-12 DIAGNOSIS — E1169 Type 2 diabetes mellitus with other specified complication: Secondary | ICD-10-CM | POA: Diagnosis not present

## 2022-07-12 ENCOUNTER — Encounter (INDEPENDENT_AMBULATORY_CARE_PROVIDER_SITE_OTHER): Payer: Self-pay | Admitting: Family Medicine

## 2022-07-12 ENCOUNTER — Ambulatory Visit (INDEPENDENT_AMBULATORY_CARE_PROVIDER_SITE_OTHER): Payer: BC Managed Care – PPO | Admitting: Family Medicine

## 2022-07-12 VITALS — BP 116/73 | HR 92 | Temp 98.2°F | Ht 63.0 in | Wt 254.4 lb

## 2022-07-12 DIAGNOSIS — E785 Hyperlipidemia, unspecified: Secondary | ICD-10-CM

## 2022-07-12 DIAGNOSIS — I152 Hypertension secondary to endocrine disorders: Secondary | ICD-10-CM

## 2022-07-12 DIAGNOSIS — E66813 Obesity, class 3: Secondary | ICD-10-CM

## 2022-07-12 DIAGNOSIS — R0602 Shortness of breath: Secondary | ICD-10-CM

## 2022-07-12 DIAGNOSIS — E1159 Type 2 diabetes mellitus with other circulatory complications: Secondary | ICD-10-CM | POA: Diagnosis not present

## 2022-07-12 DIAGNOSIS — R5383 Other fatigue: Secondary | ICD-10-CM

## 2022-07-12 DIAGNOSIS — E669 Obesity, unspecified: Secondary | ICD-10-CM | POA: Diagnosis not present

## 2022-07-12 DIAGNOSIS — Z1331 Encounter for screening for depression: Secondary | ICD-10-CM

## 2022-07-12 DIAGNOSIS — E1169 Type 2 diabetes mellitus with other specified complication: Secondary | ICD-10-CM | POA: Diagnosis not present

## 2022-07-12 DIAGNOSIS — Z7984 Long term (current) use of oral hypoglycemic drugs: Secondary | ICD-10-CM

## 2022-07-12 DIAGNOSIS — Z6841 Body Mass Index (BMI) 40.0 and over, adult: Secondary | ICD-10-CM

## 2022-07-12 DIAGNOSIS — Z7985 Long-term (current) use of injectable non-insulin antidiabetic drugs: Secondary | ICD-10-CM

## 2022-07-13 LAB — LIPID PANEL WITH LDL/HDL RATIO
Cholesterol, Total: 134 mg/dL (ref 100–199)
HDL: 43 mg/dL (ref >39)
LDL Chol Calc (NIH): 36 mg/dL (ref 0–99)
LDL/HDL Ratio: 0.8 ratio (ref 0.0–3.2)
Triglycerides: 381 mg/dL — ABNORMAL HIGH (ref 0–149)
VLDL Cholesterol Cal: 55 mg/dL — ABNORMAL HIGH (ref 5–40)

## 2022-07-13 LAB — T4, FREE: Free T4: 1.1 ng/dL (ref 0.82–1.77)

## 2022-07-13 LAB — INSULIN, RANDOM: INSULIN: 17.4 u[IU]/mL (ref 2.6–24.9)

## 2022-07-13 LAB — VITAMIN B12: Vitamin B-12: 296 pg/mL (ref 232–1245)

## 2022-07-13 LAB — VITAMIN D 25 HYDROXY (VIT D DEFICIENCY, FRACTURES): Vit D, 25-Hydroxy: 18 ng/mL — ABNORMAL LOW (ref 30.0–100.0)

## 2022-07-13 LAB — TSH: TSH: 3.11 u[IU]/mL (ref 0.450–4.500)

## 2022-07-23 ENCOUNTER — Ambulatory Visit (HOSPITAL_COMMUNITY)
Admission: RE | Admit: 2022-07-23 | Discharge: 2022-07-23 | Disposition: A | Discharge status: Home / Self Care | Payer: BC Managed Care – PPO | Source: Ambulatory Visit | Attending: Family Medicine | Admitting: Family Medicine

## 2022-07-23 ENCOUNTER — Other Ambulatory Visit (HOSPITAL_COMMUNITY): Payer: Self-pay | Admitting: Family Medicine

## 2022-07-23 DIAGNOSIS — J019 Acute sinusitis, unspecified: Secondary | ICD-10-CM

## 2022-07-23 DIAGNOSIS — R059 Cough, unspecified: Secondary | ICD-10-CM | POA: Diagnosis not present

## 2022-07-26 ENCOUNTER — Encounter (INDEPENDENT_AMBULATORY_CARE_PROVIDER_SITE_OTHER): Payer: Self-pay | Admitting: Family Medicine

## 2022-07-26 ENCOUNTER — Ambulatory Visit (INDEPENDENT_AMBULATORY_CARE_PROVIDER_SITE_OTHER): Payer: BC Managed Care – PPO | Admitting: Family Medicine

## 2022-07-26 VITALS — BP 102/69 | HR 79 | Temp 98.0°F | Ht 63.0 in | Wt 251.2 lb

## 2022-07-26 DIAGNOSIS — E1169 Type 2 diabetes mellitus with other specified complication: Secondary | ICD-10-CM | POA: Diagnosis not present

## 2022-07-26 DIAGNOSIS — E559 Vitamin D deficiency, unspecified: Secondary | ICD-10-CM

## 2022-07-26 DIAGNOSIS — E1159 Type 2 diabetes mellitus with other circulatory complications: Secondary | ICD-10-CM

## 2022-07-26 DIAGNOSIS — Z6841 Body Mass Index (BMI) 40.0 and over, adult: Secondary | ICD-10-CM

## 2022-07-26 DIAGNOSIS — E669 Obesity, unspecified: Secondary | ICD-10-CM

## 2022-07-26 DIAGNOSIS — I152 Hypertension secondary to endocrine disorders: Secondary | ICD-10-CM | POA: Diagnosis not present

## 2022-07-26 DIAGNOSIS — E785 Hyperlipidemia, unspecified: Secondary | ICD-10-CM

## 2022-07-26 DIAGNOSIS — Z7985 Long-term (current) use of injectable non-insulin antidiabetic drugs: Secondary | ICD-10-CM

## 2022-07-26 DIAGNOSIS — E538 Deficiency of other specified B group vitamins: Secondary | ICD-10-CM

## 2022-07-26 DIAGNOSIS — E66813 Obesity, class 3: Secondary | ICD-10-CM

## 2022-07-26 MED ORDER — CYANOCOBALAMIN 500 MCG PO TABS
500.0000 ug | ORAL_TABLET | Freq: Every day | ORAL | Status: DC
Start: 2022-07-26 — End: 2022-08-14

## 2022-07-26 MED ORDER — VITAMIN D (ERGOCALCIFEROL) 1.25 MG (50000 UNIT) PO CAPS: 50000.0000 [IU] | ORAL_CAPSULE | ORAL | 0 refills | Status: DC

## 2022-07-29 NOTE — Progress Notes (Unsigned)
Chief Complaint:   Loretta Luna (MR# [id number]) is a 52 y.o. female who presents for evaluation and treatment of obesity and related comorbidities. Current BMI is Body mass index is 45.06 kg/m. Loretta Luna has been struggling with her weight for many years and has been unsuccessful in either losing weight, maintaining weight loss, or reaching her healthy weight goal.  Loretta Luna is currently in the action stage of change and ready to dedicate time achieving and maintaining a healthier weight. Loretta Luna is interested in becoming our patient and working on intensive lifestyle modifications including (but not limited to) diet and exercise for weight loss.  Loretta Luna is single and works full-time at [employer]. She has a 27 year old son, who does not live with her. Pt's worst habit= sweets, donuts, and simple carbs.  Loretta Luna's habits were reviewed today and are as follows: her desired weight loss is 88 lbs, she has been heavy most of her life, she has significant food cravings issues, she is frequently drinking liquids with calories, she frequently makes poor food choices, she has problems with excessive hunger, she frequently eats larger portions than normal, she has binge eating behaviors, and she struggles with emotional eating.  Depression Screen Loretta Luna's Food and Mood (modified PHQ-9) score was 15.  Subjective:   1. Other fatigue Loretta Luna admits to daytime somnolence and admits to waking up still tired. Patient has a history of symptoms of daytime fatigue and morning fatigue. Loretta Luna generally gets 5 or 6 hours of sleep per night, and states that she has poor sleep quality. Snoring is present. Apneic episodes are not present. Epworth Sleepiness Score is 17 and reviewed with pt- abnormal high risk for OSA.  ECG done today in upper 80's, normal sinus rhythm, and no acute findings- within normal limits.  Calculated IC= 1850, measured better than expected at 2117.  2. SOB (shortness of  breath) Loretta Luna notes increasing shortness of breath with exercising and seems to be worsening over time with weight gain. She notes getting out of breath sooner with activity than she used to. This has gotten worse recently. Loretta Luna denies shortness of breath at rest or orthopnea.  3. Type 2 diabetes mellitus with obesity (Loretta Luna) Diagnosed for 14 years now. Pt was controlled up until 2-3 years ago. Her last A1c was 8.6 on 06/06/2022. She takes [place], Novolog, and Mounjaro 15 mg weekly. Pt has had no weight loss.  4. Hyperlipidemia associated with type 2 diabetes mellitus (Loretta Luna) Referred to cardiologist, Dr. Debara Luna. She was recently seen in October 2023 and had specific labs ordered for cholesterol at that time.  5. Hypertension associated with diabetes (Loretta Luna) Asymptomatic. Trinidi denies headache, visual changes, chest pain, or PND. Medication: Olmesartan  Assessment/Plan:   Orders Placed This Encounter  Procedures   CBC and differential   CBC   Microalbumin / creatinine urine ratio   Protein / creatinine ratio, urine   Basic metabolic panel   Comprehensive metabolic panel   Hepatic function panel   Hemoglobin A1c   Insulin, random   Lipid Panel With LDL/HDL Ratio   T4, free   Vitamin B12   TSH   VITAMIN D 25 Hydroxy (Vit-D Deficiency, Fractures)   EKG 12-Lead    Medications Discontinued During This Encounter  Medication Reason   glipiZIDE (GLUCOTROL XL) 5 MG 24 hr tablet Completed Course   aspirin EC 81 MG tablet Completed Course   VITAMIN D PO Completed Course     No  orders of the defined types were placed in this encounter.    1. Other fatigue Loretta Luna does feel that her weight is causing her energy to be lower than it should be. Fatigue may be related to obesity, depression or many other causes. Labs will be ordered, and in the meanwhile, Loretta Luna will focus on self care including making healthy food choices, increasing physical activity and focusing on stress reduction. Pt will  obtain evaluation for sleep apnea via PCP, as she declines today. Pt is not sure she will wear mask due to claustrophobia. Education provided and strongly encouraged to evaluate.  Lab/Orders today: - EKG 12-Lead - T4, free - Vitamin B12 - TSH - VITAMIN D 25 Hydroxy (Vit-D Deficiency, Fractures)  2. SOB (shortness of breath) Loretta Luna does feel that she gets out of breath more easily that she used to when she exercises. Loretta Luna's shortness of breath appears to be obesity related and exercise induced. She has agreed to work on weight loss and gradually increase exercise to treat her exercise induced shortness of breath. Will continue to monitor closely.  3. Type 2 diabetes mellitus with obesity (Loretta Luna) Explained to pt that she must follow prudent nutritional plan and eventually start exercising to achieve weight loss. Continue GLP-1/GIP per PCP and all medication management per PCP. We will follow closely.  Lab/Orders today: - Insulin, random - Lipid Panel With LDL/HDL Ratio  4. Hyperlipidemia associated with type 2 diabetes mellitus (Loretta Luna) Medication management for Dr. Debara Luna. Decrease saturated and trans fats. Meal plan advised and low to no simple carbs for triglycerides. We will follow alongside specialist.  5. Hypertension associated with diabetes (Loretta Luna) BP at goal. Continue ARB per PCP. We will review all labs in detail at next OV. Decrease salt intake. Follow prudent nutritional plan and weight loss.  6. Class 3 severe obesity with serious comorbidity and body mass index (BMI) of 45.0 to 49.9 in adult, unspecified obesity type [facility]) Loretta Luna is currently in the action stage of change and her goal is to continue with weight loss efforts. I recommend Loretta Luna begin the structured treatment plan as follows:  She has agreed to the Category 3 Plan with only 200 snack calories.  Pt brought in labs from PCP office from 06/06/2022- CBC, CMP, A1c. Labs abstracted into chart.  Exercise goals:  As is     Behavioral modification strategies: decreasing simple carbohydrates and avoiding temptations.  She was informed of the importance of frequent follow-up visits to maximize her success with intensive lifestyle modifications for her multiple health conditions. She was informed we would discuss her lab results at her next visit unless there is a critical issue that needs to be addressed sooner. Loretta Luna agreed to keep her next visit at the agreed upon time to discuss these results.  Objective:   Blood pressure 116/73, pulse 92, temperature 98.2 F (36.8 C), height '5\' 3"'$  (1.6 m), weight 254 lb 6.4 oz (115.4 kg), SpO2 93 %. Body mass index is 45.06 kg/m.  EKG: Normal sinus rhythm, rate 88.  Indirect Calorimeter completed today shows a VO2 of 307 and a REE of 2117.  Her calculated basal metabolic rate is 5462 thus her basal metabolic rate is better than expected.  General: Cooperative, alert, well developed, in no acute distress. HEENT: Conjunctivae and lids unremarkable. Cardiovascular: Regular rhythm.  Lungs: Normal work of breathing. Neurologic: No focal deficits.   Lab Results  Component Value Date   CREATININE 0.7 06/06/2022   BUN 12 06/06/2022   NA  140 06/06/2022   K 3.9 06/06/2022   CL 98 (A) 06/06/2022   CO2 28 07/15/2012   Lab Results  Component Value Date   ALT 29 06/06/2022   AST 33 06/06/2022   ALKPHOS 100 06/06/2022   BILITOT 0.5 03/28/2007   Lab Results  Component Value Date   HGBA1C 8.6 06/06/2022   HGBA1C 5.5 07/01/2006   Lab Results  Component Value Date   INSULIN 17.4 07/12/2022   Lab Results  Component Value Date   TSH 3.110 07/12/2022   Lab Results  Component Value Date   CHOL 134 07/12/2022   HDL 43 07/12/2022   LDLCALC 36 07/12/2022   TRIG 381 (H) 07/12/2022   CHOLHDL 4.7 Ratio 03/28/2007   Lab Results  Component Value Date   WBC 7.2 06/06/2022   HGB 15.8 06/06/2022   HCT 49 (A) 06/06/2022   MCV 78.0 07/15/2012   PLT 317 06/06/2022     Attestation Statements:   Reviewed by clinician on day of visit: allergies, medications, problem list, medical history, surgical history, family history, social history, and previous encounter notes.  I, Kathlene November, BS, CMA, am acting as transcriptionist for [employer], DO.   I have reviewed the above documentation for accuracy and completeness, and I agree with the above. Marjory Sneddon, D.O.  The [place] was signed into law in 2016 which includes the topic of electronic health records.  This provides immediate access to information in MyChart.  This includes consultation notes, operative notes, office notes, lab results and pathology reports.  If you have any questions about what you read please let us know at your next visit so we can discuss your concerns and take corrective action if need be.  We are right here with you.

## 2022-08-08 DIAGNOSIS — E7849 Other hyperlipidemia: Secondary | ICD-10-CM | POA: Diagnosis not present

## 2022-08-09 LAB — NMR, LIPOPROFILE
Cholesterol, Total: 110 mg/dL (ref 100–199)
HDL Particle Number: 34.5 umol/L (ref >=30.5)
HDL-C: 43 mg/dL (ref >39)
LDL Particle Number: 529 nmol/L (ref <1000)
LDL Size: 19.7 nm — ABNORMAL LOW (ref >20.5)
LDL-C (NIH Calc): 35 mg/dL (ref 0–99)
LP-IR Score: 59 — ABNORMAL HIGH (ref <=45)
Small LDL Particle Number: 377 nmol/L (ref <=527)
Triglycerides: 203 mg/dL — ABNORMAL HIGH (ref 0–149)

## 2022-08-13 NOTE — Progress Notes (Unsigned)
Chief Complaint:   OBESITY Loretta Luna is here to discuss her progress with her obesity treatment plan along with follow-up of her obesity related diagnoses. [name] is on the Category 3 Plan with 200 snack caloriesand states she is following her eating plan approximately 90-95% of the time. [name] states she is not exercising.   Today's visit was #: 2 Starting weight: 254 lbs Starting date: 07/12/2022 Today's weight: 251 lbs Today's date: 07/26/2022 Total lbs lost to date: 3 lbs Total lbs lost since last in-office visit: 3 lbs  Interim History: Loretta Luna is here today for her first follow-up office visit since starting the program with [place].  All blood work/ lab tests that were recently ordered by myself or an outside provider were reviewed with patient today per their request.   Extended time was spent counseling her on all new disease processes that were discovered or preexisting ones that are affected by BMI.  she understands that many of these abnormalities will need to monitored regularly along with the current treatment plan of prudent dietary changes, in which we are making each and every office visit, to improve these health parameters. Patient states that she does not like hide as she and hates very elevated time.  However she could stomach triple zero yogurt and fair life skim milk.   Subjective:   1. Type 2 diabetes mellitus with obesity ([place]) Discussed labs with patient today. Patient's last A1c was 8.6 on 06/06/2022.  Blood sugars= 113 -123,  which is the best they have been.  2 hours postprandial=160-170.  Prior blood sugar=170-200 and 250-300 respectively.  Patient is using NovoLog 17 units 3 times daily and Tresiba 80 units twice daily.  2. Hyperlipidemia associated with type 2 diabetes mellitus ([place]) Discussed labs with patient today. LDL 36 triglycerides 381.  Patient is taking Crestor.  3. Hypertension associated with diabetes ([place]) Discussed labs with patient  today. Patient is asymptomatic with no concerns, no dizziness etc.  4. Vitamin D deficiency New diagnosis.  Discussed labs with patient today. Patient is positive for fatigue.  5. B12 deficiency New diagnosis.  Discussed labs with patient today. Vitamin B12 was 226, patient is positive for fatigue.  Assessment/Plan:  No orders of the defined types were placed in this encounter.   There are no discontinued medications.   Meds ordered this encounter  Medications   DISCONTD: cyanocobalamin (VITAMIN B12) 500 MCG tablet    Sig: Take 1 tablet (500 mcg total) by mouth daily.   DISCONTD: Vitamin D, Ergocalciferol, (DRISDOL) 1.25 MG (50000 UNIT) CAPS capsule    Sig: Take 1 capsule (50,000 Units total) by mouth every 7 (seven) days.    Dispense:  4 capsule    Refill:  0     1. Type 2 diabetes mellitus with obesity ([name]) Decrease Tresiba to 74 units twice daily and wean mealtime NovoLog as seen fit.  Patient knows how to wean.  Continue Mounjaro and PNP and weight loss.  2. Hyperlipidemia associated with type 2 diabetes mellitus ([place]) LDL at goal, management per cardiology.  Continue meds per cardiology.  Long discussion with patient regarding decreased triglycerides.  Counseling done.  3. Hypertension associated with diabetes ([place]) [name] is working on healthy weight loss and exercise to improve blood pressure control. We will watch for signs of hypotension as she continues her lifestyle modifications. Blood pressure is at goal, continue medication we will follow closely.  4. Vitamin D deficiency - I again reiterated the importance  of vitamin D (as well as calcium) to their health and wellbeing.  - I reviewed possible symptoms of low Vitamin D:  low energy, depressed mood, muscle aches, joint aches, osteoporosis etc. - low Vitamin D levels may be linked to an increased risk of cardiovascular events and even increased risk of cancers- such as colon and breast.  - ideal vitamin D levels  reviewed with patient  - I recommend pt take a 50,000 IU weekly prescription vit D - see script below   - Informed patient this may be a lifelong thing, and she was encouraged to continue to take the medicine until told otherwise.    - weight loss will likely improve availability of vitamin D, thus encouraged Loretta Luna to continue with meal plan and their weight loss efforts to further improve this condition.  Thus, we will need to monitor levels regularly (every 3-4 mo on average) to keep levels within normal limits and prevent over supplementation. - pt's questions and concerns regarding this condition addressed.   Start- Vitamin D, Ergocalciferol, (DRISDOL) 1.25 MG (50000 UNIT) CAPS capsule; Take 1 capsule (50,000 Units total) by mouth every 7 (seven) days.  Dispense: 4 capsule; Refill: 0  5. B12 deficiency The diagnosis was reviewed with the patient. Counseling provided today, see below. We will continue to monitor. Orders and follow up as documented in patient record.  Counseling The body needs vitamin B12: to make red blood cells; to make DNA; and to help the nerves work properly so they can carry messages from the brain to the body.  The main causes of vitamin B12 deficiency include dietary deficiency, digestive diseases, pernicious anemia, and having a surgery in which part of the stomach or small intestine is removed.  Certain medicines can make it harder for the body to absorb vitamin B12. These medicines include: heartburn medications; some antibiotics; some medications used to treat diabetes, gout, and high cholesterol.  In some cases, there are no symptoms of this condition. If the condition leads to anemia or nerve damage, various symptoms can occur, such as weakness or fatigue, shortness of breath, and numbness or tingling in your hands and feet.   Treatment:  May include taking vitamin B12 supplements.  Avoid alcohol.  Eat lots of healthy foods that contain vitamin B12: Beef, pork,  chicken, [place], and organ meats, such as liver.  Seafood: This includes clams, rainbow trout, salmon, tuna, and haddock. Eggs.  Cereal and dairy products that are fortified: This means that vitamin B12 has been added to the food.   Start- cyanocobalamin (VITAMIN B12) 500 MCG tablet; Take 1 tablet (500 mcg total) by mouth daily.  6. Obesity with current BMI 44.5 We had a long discussion on her her PNP will improve her medical conditions.   Atziri is currently in the action stage of change. As such, her goal is to continue with weight loss efforts. She has agreed to the Category 3 Plan with 200 snack calories..   Exercise goals:  As is.  Behavioral modification strategies: increasing lean protein intake, decreasing simple carbohydrates, and planning for success.  Elnoria has agreed to follow-up with our clinic in 2-3 weeks. She was informed of the importance of frequent follow-up visits to maximize her success with intensive lifestyle modifications for her multiple health conditions.   Objective:   Pulse 79, temperature 98 F (36.7 C), height 5' 3"$  (1.6 m), weight 251 lb 3.2 oz (113.9 kg), SpO2 93 %. Body mass index is 44.5 kg/m.  General:  Cooperative, alert, well developed, in no acute distress. HEENT: Conjunctivae and lids unremarkable. Cardiovascular: Regular rhythm.  Lungs: Normal work of breathing. Neurologic: No focal deficits.   Lab Results  Component Value Date   CREATININE 0.7 06/06/2022   BUN 12 06/06/2022   NA 140 06/06/2022   K 3.9 06/06/2022   CL 98 (A) 06/06/2022   CO2 28 07/15/2012   Lab Results  Component Value Date   ALT 29 06/06/2022   AST 33 06/06/2022   ALKPHOS 100 06/06/2022   BILITOT 0.5 03/28/2007   Lab Results  Component Value Date   HGBA1C 8.6 06/06/2022   HGBA1C 5.5 07/01/2006   Lab Results  Component Value Date   INSULIN 17.4 07/12/2022   Lab Results  Component Value Date   TSH 3.110 07/12/2022   Lab Results  Component Value Date    CHOL 134 07/12/2022   HDL 43 07/12/2022   LDLCALC 36 07/12/2022   TRIG 381 (H) 07/12/2022   CHOLHDL 4.7 Ratio 03/28/2007   Lab Results  Component Value Date   VD25OH 18.0 (L) 07/12/2022   Lab Results  Component Value Date   WBC 7.2 06/06/2022   HGB 15.8 06/06/2022   HCT 49 (A) 06/06/2022   MCV 78.0 07/15/2012   PLT 317 06/06/2022   No results found for: "IRON", "TIBC", "FERRITIN"  Attestation Statements:   Reviewed by clinician on day of visit: allergies, medications, problem list, medical history, surgical history, family history, social history, and previous encounter notes.  Time spent on visit including pre-visit chart review and post-visit care and charting was 45 minutes.   I, Davy Pique, RMA, am acting as Location manager for [employer], DO.   I have reviewed the above documentation for accuracy and completeness, and I agree with the above. Marjory Sneddon, D.O.  [place] was signed into law in 2016 which includes the topic of electronic health records.  This provides immediate access to information in MyChart.  This includes consultation notes, operative notes, office notes, lab results and pathology reports.  If you have any questions about what you read please let us know at your next visit so we can discuss your concerns and take corrective action if need be.  We are right here with you.

## 2022-08-14 ENCOUNTER — Ambulatory Visit (INDEPENDENT_AMBULATORY_CARE_PROVIDER_SITE_OTHER): Payer: BC Managed Care – PPO | Admitting: Physician Assistant

## 2022-08-14 ENCOUNTER — Encounter (INDEPENDENT_AMBULATORY_CARE_PROVIDER_SITE_OTHER): Payer: Self-pay | Admitting: Physician Assistant

## 2022-08-14 VITALS — BP 106/74 | HR 85 | Temp 98.0°F | Ht 63.0 in | Wt 247.0 lb

## 2022-08-14 DIAGNOSIS — E669 Obesity, unspecified: Secondary | ICD-10-CM

## 2022-08-14 DIAGNOSIS — Z7985 Long-term (current) use of injectable non-insulin antidiabetic drugs: Secondary | ICD-10-CM

## 2022-08-14 DIAGNOSIS — E1169 Type 2 diabetes mellitus with other specified complication: Secondary | ICD-10-CM

## 2022-08-14 DIAGNOSIS — E538 Deficiency of other specified B group vitamins: Secondary | ICD-10-CM

## 2022-08-14 DIAGNOSIS — E559 Vitamin D deficiency, unspecified: Secondary | ICD-10-CM | POA: Diagnosis not present

## 2022-08-14 DIAGNOSIS — Z794 Long term (current) use of insulin: Secondary | ICD-10-CM

## 2022-08-14 DIAGNOSIS — Z6841 Body Mass Index (BMI) 40.0 and over, adult: Secondary | ICD-10-CM

## 2022-08-14 MED ORDER — CYANOCOBALAMIN 500 MCG PO TABS: 500.0000 ug | ORAL_TABLET | Freq: Every day | ORAL | Status: DC

## 2022-08-14 MED ORDER — VITAMIN D (ERGOCALCIFEROL) 1.25 MG (50000 UNIT) PO CAPS: 50000.0000 [IU] | ORAL_CAPSULE | ORAL | 0 refills | Status: DC

## 2022-08-14 NOTE — Progress Notes (Signed)
Chief Complaint:   OBESITY Loretta Luna is here to discuss her progress with her obesity treatment plan along with follow-up of her obesity related diagnoses. Loretta Luna is on the Category 3 Plan and states she is following her eating plan approximately 95% of the time. Loretta Luna states she is doing sit ups 30 minutes 3 times per week.  Today's visit was #: 3 Starting weight: 254 lbs Starting date: 07/12/2022 Today's weight: 247.8 lbs Today's date: 08/14/2022 Total lbs lost to date: 6 lbs Total lbs lost since last in-office visit: 4 lbs  Interim History: Loretta Luna has done well with weight loss. She has been focusing on trying to stick to nutrition plan consistently and figuring out what combinations of foods will work for her with in her plan. She did crave some treats they had with a [employer] celebration, but she did very well avoiding any overindulgence in the treats. She has been making better choices when eating out on Sundays after church.    Subjective:   1. Type 2 diabetes mellitus with obesity ([name]) Last A1c was 8.6 on 06/06/2022-not at goal.  She is on Tresiba 74 units twice daily with her usual NovoLog sliding scale coverage.  She is on Mounjaro 12.5 mg weekly.  She reports no side effects with Mounjaro.  She has been having some low blood sugars especially during the night and we discussed making sure that she gets in a good protein snack at least 1 hour prior to bedtime.  We are going to decrease the [place] further to 65 units twice daily and she will continue to monitor her blood sugar closely.  Her average blood sugar was between 101 -108.  Her postprandial blood sugars are consistently lower than they have been previously.  She reports her previous blood sugars fasting was generally 170-200 and postprandial was 200-300.  2. Vitamin D deficiency Last vitamin D level 18.0 on 07/12/2022-not at goal.  She is on ergocalciferol 50,000 IU once weekly.  She reports no nausea vomiting or muscle  weakness on vitamin D.  3. B12 deficiency B12 level of 296 on 07/12/2022-not at goal.  She is on B12 1000 mcg daily as the pharmacy did not have the 500 mcg daily dose.  No side effects with B12.  4. Obesity ([place])- Starting BMI 45.06   5. BMI 40.0-44.9, adult ([place]), Current BMI 43.9    Assessment/Plan:   1. Type 2 diabetes mellitus with obesity ([name]) Blood sugars are running significantly lower and we will plan to decrease Tresiba to 65 units twice daily.  She is primarily low after bedtime and we discussed getting in a small protein snack about 1 hour before bedtime to avoid hypoglycemic episodes.  Continue on Mounjaro 12.5 mg once weekly.  Continue to monitor blood sugar and continue nutrition plan to promote weight loss and improve glycemic control.  2. Vitamin D deficiency Continue ergocalciferol once weekly.  Will recheck level at least 2-3 times yearly to avoid oversupplementation. Refill- Vitamin D, Ergocalciferol, (DRISDOL) 1.25 MG (50000 UNIT) CAPS capsule; Take 1 capsule (50,000 Units total) by mouth every 7 (seven) days.  Dispense: 4 capsule; Refill: 0  3. B12 deficiency Continue B12 supplement once daily.  Will recheck level after several months.  She does report increased energy following supplementation of B12 and and vitamin D. Refill- cyanocobalamin (VITAMIN B12) 500 MCG tablet; Take 1 tablet (500 mcg total) by mouth daily.  4. Obesity ([place])- Starting BMI 45.06   5. BMI 40.0-44.9, adult (  [place]), Current BMI 43.9   Loretta Luna is currently in the action stage of change. As such, her goal is to continue with weight loss efforts. She has agreed to the Category 3 Plan.   Exercise goals: All adults should avoid inactivity. Some physical activity is better than none, and adults who participate in any amount of physical activity gain some health benefits.  Behavioral modification strategies: increasing lean protein intake, decreasing simple carbohydrates, increasing water intake,  better snacking choices, and avoiding temptations.  Loretta Luna has agreed to follow-up with our clinic in 2 weeks. She was informed of the importance of frequent follow-up visits to maximize her success with intensive lifestyle modifications for her multiple health conditions.     Objective:   Blood pressure 106/74, pulse 85, temperature 98 F (36.7 C), height 5' 3"$  (1.6 m), weight 247 lb (112 kg), SpO2 94 %. Body mass index is 43.75 kg/m.  General: Cooperative, alert, well developed, in no acute distress. HEENT: Conjunctivae and lids unremarkable. Cardiovascular: Regular rhythm.  Lungs: Normal work of breathing. Neurologic: No focal deficits.   Lab Results  Component Value Date   CREATININE 0.7 06/06/2022   BUN 12 06/06/2022   NA 140 06/06/2022   K 3.9 06/06/2022   CL 98 (A) 06/06/2022   CO2 28 07/15/2012   Lab Results  Component Value Date   ALT 29 06/06/2022   AST 33 06/06/2022   ALKPHOS 100 06/06/2022   BILITOT 0.5 03/28/2007   Lab Results  Component Value Date   HGBA1C 8.6 06/06/2022   HGBA1C 5.5 07/01/2006   Lab Results  Component Value Date   INSULIN 17.4 07/12/2022   Lab Results  Component Value Date   TSH 3.110 07/12/2022   Lab Results  Component Value Date   CHOL 134 07/12/2022   HDL 43 07/12/2022   LDLCALC 36 07/12/2022   TRIG 381 (H) 07/12/2022   CHOLHDL 4.7 Ratio 03/28/2007   Lab Results  Component Value Date   VD25OH 18.0 (L) 07/12/2022   Lab Results  Component Value Date   WBC 7.2 06/06/2022   HGB 15.8 06/06/2022   HCT 49 (A) 06/06/2022   MCV 78.0 07/15/2012   PLT 317 06/06/2022   No results found for: "IRON", "TIBC", "FERRITIN"  Obesity Behavioral Intervention:   Approximately 15 minutes were spent on the discussion below.  ASK: We discussed the diagnosis of obesity with Loretta Luna today and Loretta Luna agreed to give [place] permission to discuss obesity behavioral modification therapy today.  ASSESS: Loretta Luna has the diagnosis of obesity and her  BMI today is 43.9. Loretta Luna is in the action stage of change.   ADVISE: Loretta Luna was educated on the multiple health risks of obesity as well as the benefit of weight loss to improve her health. She was advised of the need for long term treatment and the importance of lifestyle modifications to improve her current health and to decrease her risk of future health problems.  AGREE: Multiple dietary modification options and treatment options were discussed and Loretta Luna agreed to follow the recommendations documented in the above note.  ARRANGE: Loretta Luna was educated on the importance of frequent visits to treat obesity as outlined per CMS and USPSTF guidelines and agreed to schedule her next follow up appointment today.  Attestation Statements:   Reviewed by clinician on day of visit: allergies, medications, problem list, medical history, surgical history, family history, social history, and previous encounter notes.  Leoda Smithhart,PA-C

## 2022-08-23 ENCOUNTER — Ambulatory Visit (INDEPENDENT_AMBULATORY_CARE_PROVIDER_SITE_OTHER): Payer: BC Managed Care – PPO | Admitting: Family Medicine

## 2022-08-30 ENCOUNTER — Encounter (INDEPENDENT_AMBULATORY_CARE_PROVIDER_SITE_OTHER): Payer: Self-pay | Admitting: Adult Health

## 2022-08-30 ENCOUNTER — Ambulatory Visit (INDEPENDENT_AMBULATORY_CARE_PROVIDER_SITE_OTHER): Payer: BC Managed Care – PPO | Admitting: Family Medicine

## 2022-08-30 ENCOUNTER — Ambulatory Visit (INDEPENDENT_AMBULATORY_CARE_PROVIDER_SITE_OTHER): Payer: BC Managed Care – PPO | Admitting: Adult Health

## 2022-08-30 VITALS — BP 102/68 | HR 83 | Temp 97.9°F | Ht 63.0 in | Wt 246.0 lb

## 2022-08-30 DIAGNOSIS — E538 Deficiency of other specified B group vitamins: Secondary | ICD-10-CM | POA: Diagnosis not present

## 2022-08-30 DIAGNOSIS — Z6841 Body Mass Index (BMI) 40.0 and over, adult: Secondary | ICD-10-CM

## 2022-08-30 DIAGNOSIS — E669 Obesity, unspecified: Secondary | ICD-10-CM

## 2022-08-30 DIAGNOSIS — E1169 Type 2 diabetes mellitus with other specified complication: Secondary | ICD-10-CM

## 2022-08-30 DIAGNOSIS — Z794 Long term (current) use of insulin: Secondary | ICD-10-CM

## 2022-08-30 DIAGNOSIS — Z7985 Long-term (current) use of injectable non-insulin antidiabetic drugs: Secondary | ICD-10-CM

## 2022-08-30 DIAGNOSIS — R031 Nonspecific low blood-pressure reading: Secondary | ICD-10-CM | POA: Diagnosis not present

## 2022-08-30 NOTE — Progress Notes (Signed)
Chief Complaint:   OBESITY Dalores is here to discuss her progress with her obesity treatment plan along with follow-up of her obesity related diagnoses. Marleigh is on the Category 3 Plan and states she is following her eating plan approximately 90% of the time.  Siennah states she is walking and performing sit ups 10 minutes 3 times per week.  Today's visit was #: 4 Starting weight: 254 lbs Starting date: 07/12/2022 Today's weight: 246 lbs Today's date: 08/30/2022 Total lbs lost to date: 8 lbs Total lbs lost since last in-office visit: - 1lb  Interim History:  Ms. Perry endorses more frequent eating out since last OV 2 weeks ago. When she eats out: She avoids fried meat protein, sauces, simple CHO, and appetizers   She has been monitoring her BG closely at home- denies sx's of hypoglycemia.  She reports taking PRN Lasix '20mg'$ - 1/2 tab last night due to bil lower extremity edema.  Subjective:   1. Low blood pressure reading BP soft at OV She takes Benicar '40mg'$  QD and Chlorthalidone '25mg'$  QD. She took [place] '20mg'$  1/2 tab last night due to bil lower extremity edema after consuming high Na+ lunch meat. She reports acute sx's of increased fatigue and dyspnea. She denies CP or palpitations.  Bioimpedance reflects stable weight level  08/14/22  Total Body Water (lbs) 86.4 lbs    08/30/22 11:00  Total Body Water (lbs) 87 lbs   She has f/u with [facility]/Dr. Debara Pickett 09/07/22- great!   2. B12 deficiency Daily oral B12 supplement- 539mg QD  3. Type 2 diabetes mellitus with obesity ([name]) Last A1c was 8.6 on 06/06/2022-not at goal.   PCP manages [place] is on Mounjaro 12.5 mg weekly.   Denies mass in neck, dysphagia, dyspepsia, persistent hoarseness, abdominal pain, or N/V/C  She reports no side effects with Mounjaro.    Last OV 08/14/22-Tresiba reduced to 66U Fasting BG 80s,  Fasting 70-80s when taking Tresiba 74 U  Discussed further lowering Tresbia due to BG  trending down.  Assessment/Plan:   1. Low blood pressure reading Increase fluids Monitor for Red Flag sx's if any develop seek immediate medical assistance- pt verbalized understanding/agreement. Limit Lasix use. Keep F/u with Dr. HMilly Jakob3/8/24- please relay recent increase in dyspnea  2. B12 deficiency Continue Daily oral B12 supplement- 5018m QD  3. Type 2 diabetes mellitus with obesity ([name]) Further reduce Tresiba to 60U BID Monitor BG closely- contact usKorear PCP with any fasting's <70 or consistently >200 Continue Mounjaro 12.'5mg'$   5. BMI 40.0-44.9, adult [place] Current BMI 43.59  DoChirstines currently in the action stage of change. As such, her goal is to continue with weight loss efforts. She has agreed to the Category 3 Plan.   Handouts: 100 and 200 cal Snack Sheets, Additional Break Options Sheet  Exercise goals: All adults should avoid inactivity. Some physical activity is better than none, and adults who participate in any amount of physical activity gain some health benefits.  Behavioral modification strategies: increasing lean protein intake, decreasing simple carbohydrates, increasing vegetables, increasing water intake, no skipping meals, meal planning and cooking strategies, keeping healthy foods in the home, and planning for success.  DoKeeshaas agreed to follow-up with our clinic in 2 weeks. She was informed of the importance of frequent follow-up visits to maximize her success with intensive lifestyle modifications for her multiple health conditions.    Objective:   Blood pressure 102/68, pulse 83, temperature 97.9 F (36.6 C),  height '5\' 3"'$  (1.6 m), weight 246 lb (111.6 kg), SpO2 97 %. Body mass index is 43.58 kg/m.  General: Cooperative, alert, well developed, in no acute distress. HEENT: Conjunctivae and lids unremarkable. Cardiovascular: Regular rhythm.  Lungs: Normal work of breathing. Neurologic: No focal deficits.   Lab Results  Component Value Date    CREATININE 0.7 06/06/2022   BUN 12 06/06/2022   NA 140 06/06/2022   K 3.9 06/06/2022   CL 98 (A) 06/06/2022   CO2 28 07/15/2012   Lab Results  Component Value Date   ALT 29 06/06/2022   AST 33 06/06/2022   ALKPHOS 100 06/06/2022   BILITOT 0.5 03/28/2007   Lab Results  Component Value Date   HGBA1C 8.6 06/06/2022   HGBA1C 5.5 07/01/2006   Lab Results  Component Value Date   INSULIN 17.4 07/12/2022   Lab Results  Component Value Date   TSH 3.110 07/12/2022   Lab Results  Component Value Date   CHOL 134 07/12/2022   HDL 43 07/12/2022   LDLCALC 36 07/12/2022   TRIG 381 (H) 07/12/2022   CHOLHDL 4.7 Ratio 03/28/2007   Lab Results  Component Value Date   VD25OH 18.0 (L) 07/12/2022   Lab Results  Component Value Date   WBC 7.2 06/06/2022   HGB 15.8 06/06/2022   HCT 49 (A) 06/06/2022   MCV 78.0 07/15/2012   PLT 317 06/06/2022   No results found for: "IRON", "TIBC", "FERRITIN"  Attestation Statements:   Reviewed by clinician on day of visit: allergies, medications, problem list, medical history, surgical history, family history, social history, and previous encounter notes.  I have reviewed the above documentation for accuracy and completeness, and I agree with the above. -  Lateya Dauria d. Danial Sisley, NP-C

## 2022-09-07 ENCOUNTER — Ambulatory Visit (HOSPITAL_BASED_OUTPATIENT_CLINIC_OR_DEPARTMENT_OTHER): Payer: BC Managed Care – PPO | Admitting: Internal Medicine

## 2022-09-07 ENCOUNTER — Encounter (HOSPITAL_BASED_OUTPATIENT_CLINIC_OR_DEPARTMENT_OTHER): Payer: Self-pay | Admitting: Internal Medicine

## 2022-09-07 VITALS — BP 92/62 | HR 89 | Ht 63.0 in | Wt 249.0 lb

## 2022-09-07 DIAGNOSIS — E1169 Type 2 diabetes mellitus with other specified complication: Secondary | ICD-10-CM | POA: Diagnosis not present

## 2022-09-07 DIAGNOSIS — I10 Essential (primary) hypertension: Secondary | ICD-10-CM

## 2022-09-07 DIAGNOSIS — E7849 Other hyperlipidemia: Secondary | ICD-10-CM

## 2022-09-07 DIAGNOSIS — E785 Hyperlipidemia, unspecified: Secondary | ICD-10-CM

## 2022-09-07 NOTE — Patient Instructions (Signed)
Medication Instructions:  STOP- Chlorthalidone   *If you need a refill on your cardiac medications before your next appointment, please call your pharmacy*   Lab Work: NMR in 6 Months  If you have labs (blood work) drawn today and your tests are completely normal, you will receive your results only by: [place] (if you have MyChart) OR A paper copy in the mail If you have any lab test that is abnormal or we need to change your treatment, we will call you to review the results.   Testing/Procedures: None Ordered   Follow-Up: At [facility], you and your health needs are our priority.  As part of our continuing mission to provide you with exceptional heart care, we have created designated Provider Care Teams.  These Care Teams include your primary Cardiologist (physician) and Advanced Practice Providers (APPs -  Physician Assistants and Nurse Practitioners) who all work together to provide you with the care you need, when you need it.  We recommend signing up for the patient portal called "MyChart".  Sign up information is provided on this After Visit Summary.  MyChart is used to connect with patients for Virtual Visits (Telemedicine).  Patients are able to view lab/test results, encounter notes, upcoming appointments, etc.  Non-urgent messages can be sent to your provider as well.   To learn more about what you can do with MyChart, go to [email or web address].    Your next appointment:   6 month(s)  Provider:   K. Mali Hilty, MD    Other Instructions

## 2022-09-07 NOTE — Progress Notes (Signed)
LIPID CLINIC CONSULT NOTE  Chief Complaint:  Follow-up dyslipidemia  Primary Care Physician: Celene Squibb, MD  Primary Cardiologist:  Rozann Lesches, MD  HPI:  Loretta Luna is a 52 y.o. female who is being seen today for the evaluation of dyslipidemia at the request of Celene Squibb, MD. this is a pleasant 52 year old female kindly referred for evaluation management of dyslipidemia.  Primarily she has had a history of very high triglycerides which she reports has been over 3000 in the past.  More recently lab work shows that her triglycerides have been in the 800s.  She has been primarily managed by her PCP who has her on quite a few cardiovascular/lipid-lowering medications, including Vascepa, rosuvastatin, Repatha and Nexlizet.  Her last labs in August showed total cholesterol 109, HDL 39, triglycerides 476 and LDL of 8, apparently then she started the Repatha subsequent to these labs.  She is not currently on a fibrate but reports having taken it in the past although is unclear why it was discontinued.  I did receive referral information from her primary care doctor.  She has been previously diagnosed with familial combined hyperlipidemia.  She did have a calcium score which reportedly showed no coronary calcium.  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  09/07/2022  Loretta Luna returns today for follow-up of her dyslipidemia.  Previously I felt that she was being overtreated on 5 different cholesterol medications.  I advised her to stop her Repatha as her cholesterol was essentially undetectable.  Her repeat labs are much more reasonable.  LDL particle number now 529 with an LDL-C of 35, HDL-C43, triglycerides lower at 203 and a small LDL particle number of 377.  Overall a very good-looking lipid profile.  She was happy to be without the additional medication.  She also  has been having some concerns with fatigue and recently had been noted to have some low blood pressure.  She has lost about 10 pounds after working with the [facility] wellness center.  Her blood pressures recently have been in the [id number] and 0000000 systolic.  Today blood pressure was 92/62.   PMHx:  Past Medical History:  Diagnosis Date   Anxiety    Asthma    Back pain    Chest pain    Constipation    Depression    Diabetes ([place])    Essential hypertension    Fatty liver    GERD (gastroesophageal reflux disease)    History of swelling of feet    Hypercalcemia    Hyperglycemia    Hypokalemia    IBS (irritable bowel syndrome)    Infertility, female    Insomnia    Joint pain    Mixed hyperlipidemia    Obesity    Pancreatic cyst    Polycystic ovarian syndrome    Proteinuria    Systolic murmur    Type 2 diabetes mellitus ([name])     Past Surgical History:  Procedure Laterality Date   CHOLECYSTECTOMY      FAMHx:  Family History  Problem Relation Age of Onset   COPD Mother    Hyperlipidemia Mother    Hypertension Mother    Stroke Mother    Diabetes Mother    Heart disease Mother    Obesity Mother    Hyperlipidemia Father    Hypertension Father    Stroke Father  Diabetes Father    Sleep apnea Father    Obesity Father    Hypertension Sister    Asthma Brother    Hyperlipidemia Brother    Hypertension Brother    Cancer Maternal Grandmother    COPD Maternal Grandfather    Hyperlipidemia Maternal Grandfather    Cancer Paternal Grandmother    Stroke Paternal Grandfather     SOCHx:   reports that she has never smoked. She has never used smokeless tobacco. She reports that she does not drink alcohol. No history on file for drug use.  ALLERGIES:  Allergies  Allergen Reactions   Food     shellfish   Iodine     REACTION: swelling, hives, tachypnea   Metformin And Related Diarrhea    ROS: Pertinent items noted in HPI and remainder of comprehensive ROS otherwise  negative.  HOME MEDS: Current Outpatient Medications on File Prior to Visit  Medication Sig Dispense Refill   Bempedoic Acid-Ezetimibe (NEXLIZET) 180-10 MG TABS Take by mouth.     chlorthalidone (HYGROTON) 25 MG tablet Take 25 mg by mouth daily.     Continuous Blood Gluc Sensor (DEXCOM G7 SENSOR) MISC      cyanocobalamin (VITAMIN B12) 500 MCG tablet Take 1 tablet (500 mcg total) by mouth daily.     furosemide (LASIX) 20 MG tablet Take 20 mg by mouth daily.     icosapent Ethyl (VASCEPA) 1 g capsule Take 2 g by mouth 2 (two) times daily.     loratadine (CLARITIN) 10 MG tablet Take 10 mg by mouth daily.     Magnesium 250 MG TABS Take 1 tablet by mouth 2 (two) times daily.     MOUNJARO 12.5 MG/0.5ML Pen SMARTSIG:0.5 Milliliter(s) SUB-Q Once a Week     NOVOLOG FLEXPEN 100 UNIT/ML FlexPen SMARTSIG:5-15 Unit(s) SUB-Q     olmesartan (BENICAR) 40 MG tablet Take 40 mg by mouth daily.     potassium chloride (KLOR-CON) 10 MEQ tablet Take 10 mEq by mouth 2 (two) times daily.     rosuvastatin (CRESTOR) 20 MG tablet Take 20 mg by mouth daily.     TRESIBA FLEXTOUCH 200 UNIT/ML FlexTouch Pen 74-80 Units 2 (two) times daily.     Vitamin D, Ergocalciferol, (DRISDOL) 1.25 MG (50000 UNIT) CAPS capsule Take 1 capsule (50,000 Units total) by mouth every 7 (seven) days. 4 capsule 0   No current facility-administered medications on file prior to visit.    LABS/IMAGING: No results found for this or any previous visit (from the past 48 hour(s)). No results found.  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   Loretta Luna has had excellent improvement in her lipids and  is still very well-controlled on her current regimen.  Genetic testing showed multiple APO B and an APO E (E3/E4) variation, which likely explains her high cholesterol.  Blood pressure was noted to be low today and recently has been low.  She has been fatigued and sounds like symptomatic.  She is on olmesartan, furosemide and chlorthalidone.  I advised her to stop the chlorthalidone but continue the other medications.  She should monitor her blood pressures and report back to [place].  Plan otherwise to follow-up with [place] in 6 months or sooner as necessary.  Pixie Casino, MD, [employer], [place] Director of the Advanced Lipid Disorders &  Cardiovascular Risk Reduction Clinic Diplomate of the American Board of Clinical Lipidology Attending Cardiologist  Direct Dial: [phone]  Fax: [phone]  Website:  [email or web address] 09/07/2022, 8:58 AM

## 2022-09-13 ENCOUNTER — Encounter (INDEPENDENT_AMBULATORY_CARE_PROVIDER_SITE_OTHER): Payer: Self-pay | Admitting: Family Medicine

## 2022-09-13 ENCOUNTER — Ambulatory Visit (INDEPENDENT_AMBULATORY_CARE_PROVIDER_SITE_OTHER): Payer: BC Managed Care – PPO | Admitting: Family Medicine

## 2022-09-13 VITALS — BP 112/79 | HR 72 | Temp 97.9°F | Ht 63.0 in | Wt 247.4 lb

## 2022-09-13 DIAGNOSIS — K5904 Chronic idiopathic constipation: Secondary | ICD-10-CM

## 2022-09-13 DIAGNOSIS — E1159 Type 2 diabetes mellitus with other circulatory complications: Secondary | ICD-10-CM

## 2022-09-13 DIAGNOSIS — Z6841 Body Mass Index (BMI) 40.0 and over, adult: Secondary | ICD-10-CM

## 2022-09-13 DIAGNOSIS — E785 Hyperlipidemia, unspecified: Secondary | ICD-10-CM

## 2022-09-13 DIAGNOSIS — I152 Hypertension secondary to endocrine disorders: Secondary | ICD-10-CM | POA: Diagnosis not present

## 2022-09-13 DIAGNOSIS — E1169 Type 2 diabetes mellitus with other specified complication: Secondary | ICD-10-CM | POA: Diagnosis not present

## 2022-09-13 DIAGNOSIS — E538 Deficiency of other specified B group vitamins: Secondary | ICD-10-CM

## 2022-09-13 DIAGNOSIS — E559 Vitamin D deficiency, unspecified: Secondary | ICD-10-CM

## 2022-09-13 DIAGNOSIS — Z7985 Long-term (current) use of injectable non-insulin antidiabetic drugs: Secondary | ICD-10-CM

## 2022-09-13 DIAGNOSIS — E669 Obesity, unspecified: Secondary | ICD-10-CM

## 2022-09-13 DIAGNOSIS — Z794 Long term (current) use of insulin: Secondary | ICD-10-CM

## 2022-09-13 MED ORDER — MAGNESIUM 250 MG PO TABS: ORAL_TABLET | ORAL | Status: AC

## 2022-09-13 MED ORDER — POLYETHYLENE GLYCOL 3350 17 GM/SCOOP PO POWD: ORAL | 1 refills | Status: DC

## 2022-09-13 MED ORDER — VITAMIN D (ERGOCALCIFEROL) 1.25 MG (50000 UNIT) PO CAPS: 50000.0000 [IU] | ORAL_CAPSULE | ORAL | 0 refills | Status: DC

## 2022-09-13 NOTE — Progress Notes (Signed)
Loretta Luna, D.O.  ABFM, ABOM Specializing in Clinical Bariatric Medicine  Office located at: [address], [place]  [address]     Assessment and Plan:   No orders of the defined types were placed in this encounter.   Medications Discontinued During This Encounter  Medication Reason   Magnesium 250 MG TABS Reorder   Vitamin D, Ergocalciferol, (DRISDOL) 1.25 MG (50000 UNIT) CAPS capsule Reorder     Meds ordered this encounter  Medications   Vitamin D, Ergocalciferol, (DRISDOL) 1.25 MG (50000 UNIT) CAPS capsule    Sig: Take 1 capsule (50,000 Units total) by mouth every 7 (seven) days.    Dispense:  4 capsule    Refill:  0   Magnesium 250 MG TABS    Sig: May use CALM supplement- '325mg'$  mag 1-twice daily   polyethylene glycol powder (GLYCOLAX/MIRALAX) 17 GM/SCOOP powder    Sig: Prn 1 or 2 times daily as needed    Dispense:  850 g    Refill:  1    BMI 40.0-44.9, adult ([name]), Current BMI 43.8 Obesity ([name])- Starting BMI 45.06/DATE 07/12/22 Assessment: Condition is Not optimized.. Biometric data collected today, was reviewed with patient.  Fat mass has increased by 1.4lb. Muscle mass is unchanged. Total body water has increased by 2.4lb. She has been on '15mg'$  Mounjaro weekly since November.   Plan: Continue Category 3 meal plan with breakfast and lunch options with 200 calories of snacks.  Patient will work on improving adherence to meal plan. Continue Mounjaro '15mg'$  weekly.  Vitamin D deficiency Assessment: Condition is Not optimized.. Labs were reviewed.  Lab Results  Component Value Date   VD25OH 18.0 (L) 07/12/2022  Patient compliant with Ergocalciferol 50K IU once a week- denies any intolerances.  Plan: Continue with Ergocalciferol 50K IU once a week.    - weight loss will likely improve availability of vitamin D, thus encouraged Loretta Luna to continue with meal plan and their weight loss efforts to further improve this condition.  Thus, we will need to  monitor levels regularly (every 3-4 mo on average) to keep levels within normal limits and prevent over supplementation.   Hyperlipidemia associated with type 2 diabetes mellitus ([place]) Assessment: Condition is Improving, but not optimized.. Labs were reviewed.  Lab Results  Component Value Date   CHOL 134 07/12/2022   HDL 43 07/12/2022   LDLCALC 36 07/12/2022   TRIG 381 (H) 07/12/2022   CHOLHDL 4.7 Ratio 03/28/2007  Patient compliant with '20mg'$  Crestor daily, denies any negative side effects.  Plan: She agrees to continue with '20mg'$  Crestor daily and our treatment plan of a heart-heathy, low cholesterol meal plan - I stressed the importance that patient continue with our prudent nutritional plan that is low in saturated and trans fats, and low in fatty carbs to improve these numbers.   - We will continue routine screening as patient continues to achieve health goals along their weight loss journey   Hypertension associated with diabetes ([name]) Assessment: Condition is Not optimized.. Labs were reviewed.  Last 3 blood pressure readings in our office are as follows: BP Readings from Last 3 Encounters:  09/13/22 112/79  09/07/22 92/62  08/30/22 102/68  The CVD Risk score ([name], et al., 2008) failed to calculate for the following reasons:   CVD risk score not calculated Lab Results  Component Value Date   CREATININE 0.7 06/06/2022  She states that her blood pressure has been low recently. She takes half a pill  of '20mg'$  Lasix everyday.  She is compliant with Benicar 40 mg daily. Her cardiologist Dr. Debara Pickett d/c her Cholithiadone on 3/8. She has noticed increased leg swelling and occasional palpitations since that time. She is still using Lasix with minimal improvement of her swelling.  Plan: Continue with Benicar '40mg'$  daily and half a pill of Lasiz '20mg'$  daily per recommendations of her PCP and specialists. I advised her to follow up with her PCP and/or cardiologist regarding her  swelling as they may want to adjust her regimen.   Type 2 diabetes mellitus with obesity ([name]) Assessment: Condition is Not optimized.. Labs were reviewed.  Lab Results  Component Value Date   HGBA1C 8.6 06/06/2022   HGBA1C 5.5 07/01/2006   INSULIN 17.4 07/12/2022  Patient is compliant with Novolog Flexplen 5-15 units, Mounjaro '15mg'$  weekly, and Tresiba Flextouch 74-80 two units daily. She denies any negative side effects or GI intolerance.  Plan: Continue with Novolog Flexpen 5-15 units, Mounjaro '15mg'$  weekly, and Tresiba Flextouch 74-80 two units daily.   Continue with weight loss via exercise and their meal plan we devised to help decrease the risk of progressing to diabetes.    B12 deficiency Assessment: Condition is Improving, but not optimized.. Labs were reviewed.    Lab Results  Component Value Date   [id number] 07/12/2022  Patient compliant with 500 mcg  of Vitamin B12 daily   Plan: Continue with 500 mcg Vitamin B12 daily.    Chronic Idiopathic Constipation  Assessment: Condition is Not optimized..  Since last office visit she reports being constipiated. She has been using an OTC stool softener once a day and doing manual extractions. Her constipation began after running out of magnesium.   Plan: Patient recommended to resume OTC Magnesium Supplement and Miralax 1-2 times daily as needed- Rx given for Miralax today.     TREATMENT PLAN FOR OBESITY:  Recommended Dietary Goals Theres is currently in the action stage of change. As such, her goal is to continue weight management plan. She has agreed to continue the Category 3 Plan with breakfast and lunch options and 200 calories snack.   Behavioral Intervention Additional resources provided today: Category 3 Meal Plan handout, lunch options and breakfast options handout.  Evidence-based interventions for health behavior change were utilized today including the discussion of self monitoring techniques, problem-solving  barriers and SMART goal setting techniques.   Regarding patient's less desirable eating habits and patterns, we employed the technique of small changes.  Pt will specifically work on: continues with meal plan and choose meals from lunch and breafast handout for next visit.    Recommended Physical Activity Goals Ryin has been advised to work up to 150 minutes of moderate intensity aerobic activity a week and strengthening exercises 2-3 times per week for cardiovascular health, weight loss maintenance and preservation of muscle mass.  She has agreed to Continue current level of physical activity   Pharmacotherapy She takes Mounjaro 15 mg weekly for weight loss.   FOLLOW UP: Return in about 3 weeks (around 10/04/2022). She was informed of the importance of frequent follow up visits to maximize her success with intensive lifestyle modifications for her multiple health conditions.  Subjective:   Chief complaint: Obesity Tierra is here to discuss her progress with her obesity treatment plan. She is on the the Category 3 Plan with breakfast and lunch options and states she is following her eating plan approximately 90% of the time. She states she is exercising 10 minutes 4 days per  week.  Interval History:  Loretta Luna is here for a follow up office visit. We reviewed her meal plan and all questions were answered. Patient's food recall appears to be accurate and consistent with what is on plan when she is following it.      She reports stress from work, which she associates with eating unhealthy. She is also not sleeping well due to shoulder pain.  She reports that she has been dealing with severe constipation since running out of OTC Magnesium 2 weeks ago. She has been using an OTC stool softener once a day and doing manual extractions.   Pharmacotherapy for weight loss: She is currently taking Mounjaro 15mg  for medical weight loss. Denies side effects.  Her blood sugars have been very well  controlled between 87-118 fasting and 134 with eating. She has not had any more nighttime alerts for low blood sugars.  Review of Systems:  Pertinent positives were addressed with patient today.  Weight Summary and Biometrics   No data recorded  No data recorded  No data recorded  No data recorded  No data recorded  No data recorded   Objective:   PHYSICAL EXAM:  Blood pressure 112/79, pulse 72, temperature 97.9 F (36.6 C), height 5\' 3"  (1.6 m), weight 247 lb 6.4 oz (112.2 kg), SpO2 97 %. Body mass index is 43.82 kg/m.  General: Well Developed, well nourished, and in no acute distress.  HEENT: Normocephalic, atraumatic Skin: Warm and dry, cap RF less 2 sec, good turgor Chest:  Normal excursion, shape, no gross abn Respiratory: speaking in full sentences, no conversational dyspnea NeuroM-Sk: Ambulates w/o assistance, moves * 4 Psych: A and O *3, insight good, mood-full  DIAGNOSTIC DATA REVIEWED:  BMET    Component Value Date/Time   NA 140 06/06/2022 0000   K 3.9 06/06/2022 0000   CL 98 (A) 06/06/2022 0000   CO2 28 07/15/2012 1058   GLUCOSE 120 (H) 07/15/2012 1058   BUN 12 06/06/2022 0000   CREATININE 0.7 06/06/2022 0000   CREATININE 0.77 07/15/2012 1058   CALCIUM 10.4 06/06/2022 0000   Lab Results  Component Value Date   HGBA1C 8.6 06/06/2022   HGBA1C 5.5 07/01/2006   Lab Results  Component Value Date   INSULIN 17.4 07/12/2022   Lab Results  Component Value Date   TSH 3.110 07/12/2022   CBC    Component Value Date/Time   WBC 7.2 06/06/2022 0000   WBC 8.2 07/15/2012 1058   RBC 5.81 (A) 06/06/2022 0000   HGB 15.8 06/06/2022 0000   HCT 49 (A) 06/06/2022 0000   PLT 317 06/06/2022 0000   MCV 78.0 07/15/2012 1058   MCH 26.3 07/15/2012 1058   MCHC 33.7 07/15/2012 1058   RDW 15.5 07/15/2012 1058   Iron Studies No results found for: "IRON", "TIBC", "FERRITIN", "IRONPCTSAT" Lipid Panel     Component Value Date/Time   CHOL 134 07/12/2022 1055    TRIG 381 (H) 07/12/2022 1055   HDL 43 07/12/2022 1055   CHOLHDL 4.7 Ratio 03/28/2007 2157   VLDL NOT CALC mg/dL 03/28/2007 2157   LDLCALC 36 07/12/2022 1055   Hepatic Function Panel     Component Value Date/Time   PROT 7.1 03/28/2007 2157   ALBUMIN 4.6 06/06/2022 0000   AST 33 06/06/2022 0000   ALT 29 06/06/2022 0000   ALKPHOS 100 06/06/2022 0000   BILITOT 0.5 03/28/2007 2157      Component Value Date/Time   TSH 3.110 07/12/2022 1055  Nutritional Lab Results  Component Value Date   VD25OH 18.0 (L) 07/12/2022    Attestations:   Reviewed by clinician on day of visit: allergies, medications, problem list, medical history, surgical history, family history, social history, and previous encounter notes.   I,Special Puri,acting as a Education administrator for [employer], DO.,have documented all relevant documentation on the behalf of Mellody Dance, DO,as directed by  Mellody Dance, DO while in the presence of Mellody Dance, DO.   I, Mellody Dance, DO, have reviewed all documentation for this visit. The documentation on 09/19/22 for the exam, diagnosis, procedures, and orders are all accurate and complete.

## 2022-09-14 ENCOUNTER — Encounter (HOSPITAL_BASED_OUTPATIENT_CLINIC_OR_DEPARTMENT_OTHER): Payer: Self-pay | Admitting: Internal Medicine

## 2022-09-25 DIAGNOSIS — D1771 Benign lipomatous neoplasm of kidney: Secondary | ICD-10-CM | POA: Diagnosis not present

## 2022-09-25 DIAGNOSIS — K760 Fatty (change of) liver, not elsewhere classified: Secondary | ICD-10-CM | POA: Diagnosis not present

## 2022-09-25 DIAGNOSIS — Z9049 Acquired absence of other specified parts of digestive tract: Secondary | ICD-10-CM | POA: Diagnosis not present

## 2022-09-25 DIAGNOSIS — K862 Cyst of pancreas: Secondary | ICD-10-CM | POA: Diagnosis not present

## 2022-10-04 ENCOUNTER — Encounter (INDEPENDENT_AMBULATORY_CARE_PROVIDER_SITE_OTHER): Payer: Self-pay | Admitting: Physician Assistant

## 2022-10-04 ENCOUNTER — Ambulatory Visit (INDEPENDENT_AMBULATORY_CARE_PROVIDER_SITE_OTHER): Payer: BC Managed Care – PPO | Admitting: Physician Assistant

## 2022-10-04 VITALS — BP 110/75 | HR 74 | Temp 97.9°F | Ht 63.0 in | Wt 242.0 lb

## 2022-10-04 DIAGNOSIS — E559 Vitamin D deficiency, unspecified: Secondary | ICD-10-CM | POA: Diagnosis not present

## 2022-10-04 DIAGNOSIS — E669 Obesity, unspecified: Secondary | ICD-10-CM

## 2022-10-04 DIAGNOSIS — I152 Hypertension secondary to endocrine disorders: Secondary | ICD-10-CM | POA: Diagnosis not present

## 2022-10-04 DIAGNOSIS — Z7984 Long term (current) use of oral hypoglycemic drugs: Secondary | ICD-10-CM

## 2022-10-04 DIAGNOSIS — E1159 Type 2 diabetes mellitus with other circulatory complications: Secondary | ICD-10-CM | POA: Diagnosis not present

## 2022-10-04 DIAGNOSIS — E1169 Type 2 diabetes mellitus with other specified complication: Secondary | ICD-10-CM

## 2022-10-04 DIAGNOSIS — Z794 Long term (current) use of insulin: Secondary | ICD-10-CM

## 2022-10-04 DIAGNOSIS — Z6841 Body Mass Index (BMI) 40.0 and over, adult: Secondary | ICD-10-CM

## 2022-10-04 MED ORDER — VITAMIN D (ERGOCALCIFEROL) 1.25 MG (50000 UNIT) PO CAPS
50000.0000 [IU] | ORAL_CAPSULE | ORAL | 0 refills | Status: DC
Start: 2022-10-04 — End: 2022-12-05

## 2022-10-04 NOTE — Assessment & Plan Note (Signed)
Hypertension Hypertension well controlled, improved, and no significant medication side effects noted.  Medication(s): Olmesartan 40 mg daily and Lasix 20 mg daily - no side effects.  Chlorthalidone was stopped due to some soft BP's and has been doing well with this change.  PLAN per cardiology notes : 1.   Loretta Luna has had excellent improvement in her lipids and is still very well-controlled on her current regimen.  Genetic testing showed multiple APO B and an APO E (E3/E4) variation, which likely explains her high cholesterol.  Blood pressure was noted to be low today and recently has been low.  She has been fatigued and sounds like symptomatic.  She is on olmesartan, furosemide and chlorthalidone.  I advised her to stop the chlorthalidone but continue the other medications. BP Readings from Last 3 Encounters:  10/04/22 110/75  09/13/22 112/79  09/07/22 92/62   Lab Results  Component Value Date   CREATININE 0.7 06/06/2022   CREATININE 0.77 07/15/2012   CREATININE 0.86 03/28/2007   No results found for: "GFR"  Plan: Continue all antihypertensives at current dosages. Continue to work on nutrition plan to promote weight loss and improve BP control.  Will continue to follow closely as continues to loose weight. Monitor for hypotension.

## 2022-10-04 NOTE — Progress Notes (Signed)
Office: [phone]  /  Fax: [phone]  WEIGHT SUMMARY AND BIOMETRICS  Vitals Temp: 97.9 F (36.6 C) BP: 110/75 Pulse Rate: 74 SpO2: 99 %   Anthropometric Measurements Height: 5\' 3"  (1.6 m) Weight: 242 lb (109.8 kg) BMI (Calculated): 42.88 Weight at Last Visit: 247 lb Weight Lost Since Last Visit: 5 lb Starting Weight: 254 lb Total Weight Loss (lbs): 12 lb (5.443 kg)   Body Composition  Body Fat %: 47.2 % Fat Mass (lbs): 114.4 lbs Muscle Mass (lbs): 121.4 lbs Total Body Water (lbs): 85.8 lbs Visceral Fat Rating : 15   Other Clinical Data Fasting: yes Labs: no Today's Visit #: 6 Starting Date: 07/12/22     HPI  Chief Complaint: OBESITY  Loretta Luna is here to discuss her progress with her obesity treatment plan. She is on the the Category 3 Plan and states she is following her eating plan approximately 60 % of the time. She states she is exercising 0 minutes 0 times per week.   Interval History:  Since last office visit she has done well with weight loss. Down 5 lbs. Out of Mounjaro 15 mg weekly  x 2 weeks due to drug shortage( Rx is by PCP) Had Ozempic 1 mg and took this in interim. Saw Gen Surgery at [facility]- Dr. 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 There was hepatic steatosis noted.  Needs to see Gyn for follow up of left adnexal cyst and provided name of GYN today.   Followed up with [facility]- Dr. Debara Pickett for BP management, lower extremity edema and hypertriglyceridemia and medications adjusted- PLAN per cardiology note 09/07/22: 1.   Ms. Rodeheaver has had excellent improvement in her lipids and is still very well-controlled on her current regimen.  Genetic testing showed multiple APO B and an APO E (E3/E4) variation, which likely explains her high cholesterol.  Blood pressure was noted to be low today and recently has been low.  She has been fatigued and sounds like symptomatic.  She is on olmesartan,  furosemide and chlorthalidone.  I advised her to stop the chlorthalidone but continue the other medications.   Pharmacotherapy: Mounjaro 15 mg weekly per PCP. Has evaluation with Endocrinology pending.   PHYSICAL EXAM:  Blood pressure 110/75, pulse 74, temperature 97.9 F (36.6 C), height 5\' 3"  (1.6 m), weight 242 lb (109.8 kg), SpO2 99 %. Body mass index is 42.87 kg/m.  General: She is overweight, cooperative, alert, well developed, and in no acute distress. PSYCH: Has normal mood, affect and thought process.   Cardiovascular: regular rhythm Lungs: Normal breathing effort, no conversational dyspnea. Neuro: no focal deficits  DIAGNOSTIC DATA REVIEWED:  BMET    Component Value Date/Time   NA 140 06/06/2022 0000   K 3.9 06/06/2022 0000   CL 98 (A) 06/06/2022 0000   CO2 28 07/15/2012 1058   GLUCOSE 120 (H) 07/15/2012 1058   BUN 12 06/06/2022 0000   CREATININE 0.7 06/06/2022 0000   CREATININE 0.77 07/15/2012 1058   CALCIUM 10.4 06/06/2022 0000   Lab Results  Component Value Date   HGBA1C 8.6 06/06/2022   HGBA1C 5.5 07/01/2006   Lab Results  Component Value Date   INSULIN 17.4 07/12/2022   Lab Results  Component Value Date   TSH 3.110 07/12/2022   CBC    Component Value Date/Time   WBC 7.2 06/06/2022 0000   WBC 8.2 07/15/2012 1058   RBC 5.81 (A) 06/06/2022 0000  HGB 15.8 06/06/2022 0000   HCT 49 (A) 06/06/2022 0000   PLT 317 06/06/2022 0000   MCV 78.0 07/15/2012 1058   MCH 26.3 07/15/2012 1058   MCHC 33.7 07/15/2012 1058   RDW 15.5 07/15/2012 1058   Iron Studies No results found for: "IRON", "TIBC", "FERRITIN", "IRONPCTSAT" Lipid Panel     Component Value Date/Time   CHOL 134 07/12/2022 1055   TRIG 381 (H) 07/12/2022 1055   HDL 43 07/12/2022 1055   CHOLHDL 4.7 Ratio 03/28/2007 2157   VLDL NOT CALC mg/dL 03/28/2007 2157   LDLCALC 36 07/12/2022 1055   Hepatic Function Panel     Component Value Date/Time   PROT 7.1 03/28/2007 2157   ALBUMIN 4.6  06/06/2022 0000   AST 33 06/06/2022 0000   ALT 29 06/06/2022 0000   ALKPHOS 100 06/06/2022 0000   BILITOT 0.5 03/28/2007 2157      Component Value Date/Time   TSH 3.110 07/12/2022 1055   Nutritional Lab Results  Component Value Date   VD25OH 18.0 (L) 07/12/2022    ASSOCIATED CONDITIONS ADDRESSED TODAY  ASSESSMENT AND PLAN  Problem List Items Addressed This Visit     Type 2 diabetes mellitus with obesity - Primary    Type 2 Diabetes Mellitus with other specified complication, with long-term current use of insulin HgbA1c is at goal. Last A1c was 5.8!!! at [facility]: On CGM Episodes of hypoglycemia: no Medication(s): Mounjaro 15 mg SQ weekly and Tresiba 74-80 units twice daily based on CGM.     No side effects with Mounjaro.   Lab Results  Component Value Date   HGBA1C 8.6 06/06/2022   HGBA1C 5.5 07/01/2006   Lab Results  Component Value Date   LDLCALC 36 07/12/2022   CREATININE 0.7 06/06/2022  No results found for: "GFR"  Plan: Continue Mounjaro 15 mg SQ weekly- prescribed by PCP. She has not been seen by Endocrinology yet.  Her A1c is much improved on Mounjaro !!  She is continuing to do well on nutrition plan to promote weight loss and improve glycemic control!        Relevant Medications   MOUNJARO 15 MG/0.5ML Pen   Hypertension associated with diabetes    Hypertension Hypertension well controlled, improved, and no significant medication side effects noted.  Medication(s): Olmesartan 40 mg daily and Lasix 20 mg daily - no side effects.  Chlorthalidone was stopped due to some soft BP's and has been doing well with this change.  PLAN per cardiology notes : 1.   Ms. Wadell has had excellent improvement in her lipids and is still very well-controlled on her current regimen.  Genetic testing showed multiple APO B and an APO E (E3/E4) variation, which likely explains her high cholesterol.  Blood pressure was noted to be low today and recently has been low.   She has been fatigued and sounds like symptomatic.  She is on olmesartan, furosemide and chlorthalidone.  I advised her to stop the chlorthalidone but continue the other medications. BP Readings from Last 3 Encounters:  10/04/22 110/75  09/13/22 112/79  09/07/22 92/62   Lab Results  Component Value Date   CREATININE 0.7 06/06/2022   CREATININE 0.77 07/15/2012   CREATININE 0.86 03/28/2007  No results found for: "GFR"  Plan: Continue all antihypertensives at current dosages. Continue to work on nutrition plan to promote weight loss and improve BP control.  Will continue to follow closely as continues to loose weight. Monitor for hypotension.  Relevant Medications   MOUNJARO 15 MG/0.5ML Pen   Vitamin D deficiency    Vitamin D Deficiency Vitamin D is not at goal of 50.  Most recent vitamin D level was 18.0. She is on  prescription ergocalciferol 50,000 IU weekly. No side effects with Ergocalciferol.  Lab Results  Component Value Date   VD25OH 18.0 (L) 07/12/2022   Plan: Continue and refill  prescription ergocalciferol 50,000 IU weekly Low vitamin D levels can be associated with adiposity and may result in leptin resistance and weight gain. Also associated with fatigue. Currently on vitamin D supplementation without any adverse effects.         Relevant Medications   Vitamin D, Ergocalciferol, (DRISDOL) 1.25 MG (50000 UNIT) CAPS capsule   BMI 40.0-44.9, adult ([name]), Current BMI 43.59   Relevant Medications   MOUNJARO 15 MG/0.5ML Pen   Obesity ([place])- Starting BMI 45.06/DATE 07/12/22   Relevant Medications   MOUNJARO 15 MG/0.5ML Pen   She has done very well with weight loss. Down 12 lbs since January and marked improvement in A1c down to 5.8 [facility]!   TREATMENT PLAN FOR OBESITY:  Recommended Dietary Goals  Rosealynn is currently in the action stage of change. As such, her goal is to continue weight management plan. She has agreed to the Category 3  Plan.  Behavioral Intervention  We discussed the following Behavioral Modification Strategies today: increasing lean protein intake, decreasing simple carbohydrates , increasing fiber rich foods, avoiding skipping meals, increasing water intake, emotional eating strategies and understanding the difference between hunger signals and cravings, and planning for success.  Additional resources provided today: NA  Recommended Physical Activity Goals  Nupur has been advised to work up to 150 minutes of moderate intensity aerobic activity a week and strengthening exercises 2-3 times per week for cardiovascular health, weight loss maintenance and preservation of muscle mass.   She has agreed to Continue current level of physical activity    Pharmacotherapy We discussed various medication options to help Murel with her weight loss efforts and we both agreed to continue Mounjaro 15 mg weekly per PCP orders and continue to work on nutrition plan and behavioral strategies to promote weight loss.    Return in about 3 weeks (around 10/25/2022).Marland Kitchen She was informed of the importance of frequent follow up visits to maximize her success with intensive lifestyle modifications for her multiple health conditions.   ATTESTASTION STATEMENTS:  Reviewed by clinician on day of visit: allergies, medications, problem list, medical history, surgical history, family history, social history, and previous encounter notes.   I have personally spent 42 minutes total time today in preparation, patient care, nutritional counseling and documentation for this visit, including the following: review of clinical lab tests; review of medical tests/procedures/services.      Khalid Lacko, PA-C

## 2022-10-04 NOTE — Assessment & Plan Note (Signed)
Vitamin D Deficiency Vitamin D is not at goal of 50.  Most recent vitamin D level was 18.0. She is on  prescription ergocalciferol 50,000 IU weekly. No side effects with Ergocalciferol.  Lab Results  Component Value Date   VD25OH 18.0 (L) 07/12/2022    Plan: Continue and refill  prescription ergocalciferol 50,000 IU weekly Low vitamin D levels can be associated with adiposity and may result in leptin resistance and weight gain. Also associated with fatigue. Currently on vitamin D supplementation without any adverse effects.

## 2022-10-04 NOTE — Assessment & Plan Note (Addendum)
Type 2 Diabetes Mellitus with other specified complication, with long-term current use of insulin HgbA1c is at goal. Last A1c was 5.8!!! at [place] [facility]: On CGM Episodes of hypoglycemia: no Medication(s): Mounjaro 15 mg SQ weekly and Tresiba 74-80 units twice daily based on CGM.     No side effects with Mounjaro.   Lab Results  Component Value Date   HGBA1C 8.6 06/06/2022   HGBA1C 5.5 07/01/2006   Lab Results  Component Value Date   LDLCALC 36 07/12/2022   CREATININE 0.7 06/06/2022   No results found for: "GFR"  Plan: Continue Mounjaro 15 mg SQ weekly- prescribed by PCP. She has not been seen by Endocrinology yet.  Her A1c is much improved on Mounjaro !!  She is continuing to do well on nutrition plan to promote weight loss and improve glycemic control!

## 2022-10-08 ENCOUNTER — Encounter: Payer: Self-pay | Admitting: Nurse Practitioner

## 2022-10-09 ENCOUNTER — Encounter: Payer: Self-pay | Admitting: Nurse Practitioner

## 2022-10-10 ENCOUNTER — Ambulatory Visit: Payer: BC Managed Care – PPO | Admitting: Nurse Practitioner

## 2022-10-10 ENCOUNTER — Encounter: Payer: Self-pay | Admitting: Nurse Practitioner

## 2022-10-10 VITALS — BP 110/80 | HR 66 | Ht 63.0 in | Wt 244.4 lb

## 2022-10-10 DIAGNOSIS — Z794 Long term (current) use of insulin: Secondary | ICD-10-CM

## 2022-10-10 DIAGNOSIS — E119 Type 2 diabetes mellitus without complications: Secondary | ICD-10-CM

## 2022-10-10 LAB — POCT GLYCOSYLATED HEMOGLOBIN (HGB A1C): Hemoglobin A1C: 5.5 % (ref 4.0–5.6)

## 2022-10-10 MED ORDER — TRESIBA FLEXTOUCH 200 UNIT/ML ~~LOC~~ SOPN: 30.0000 [IU] | PEN_INJECTOR | Freq: Every evening | SUBCUTANEOUS | 3 refills | Status: DC

## 2022-10-10 MED ORDER — NOVOLOG FLEXPEN 100 UNIT/ML ~~LOC~~ SOPN: 5.0000 [IU] | PEN_INJECTOR | Freq: Three times a day (TID) | SUBCUTANEOUS | 3 refills | Status: DC

## 2022-10-10 MED ORDER — TIRZEPATIDE 10 MG/0.5ML ~~LOC~~ SOAJ: 10.0000 mg | SUBCUTANEOUS | 1 refills | Status: DC

## 2022-10-10 MED ORDER — DEXCOM G7 SENSOR MISC: 3 refills | Status: DC

## 2022-10-10 NOTE — Progress Notes (Signed)
Endocrinology Consult Note       10/10/2022, 12:02 PM   Subjective:    Patient ID: Loretta DaneDonna M Adkins, female    DOB: 1971/04/24.  Loretta DaneDonna M Petit is being seen in consultation for management of currently uncontrolled symptomatic diabetes requested by  Benita StabileHall, John Z, MD.  She also sees [facility] in [place].   Past Medical History:  Diagnosis Date   Anxiety    Asthma    Back pain    Chest pain    Constipation    Depression    Diabetes    Essential hypertension    Fatty liver    GERD (gastroesophageal reflux disease)    History of swelling of feet    Hypercalcemia    Hyperglycemia    Hypokalemia    IBS (irritable bowel syndrome)    Infertility, female    Insomnia    Joint pain    Mixed hyperlipidemia    Obesity    Pancreatic cyst    Polycystic ovarian syndrome    Proteinuria    Systolic murmur    Type 2 diabetes mellitus     Past Surgical History:  Procedure Laterality Date   CHOLECYSTECTOMY      Social History   Socioeconomic History   Marital status: Single    Spouse name: Not on file   Number of children: Not on file   Years of education: Not on file   Highest education level: Not on file  Occupational History   Not on file  Tobacco Use   Smoking status: Never   Smokeless tobacco: Never  Substance and Sexual Activity   Alcohol use: No    Alcohol/week: 0.0 standard drinks of alcohol   Drug use: Not on file   Sexual activity: Not on file  Other Topics Concern   Not on file  Social History Narrative   Not on file   Social Determinants of Health   Financial Resource Strain: Not on file  Food Insecurity: Not on file  Transportation Needs: Not on file  Physical Activity: Not on file  Stress: Not on file  Social Connections: Not on file    Family History  Problem Relation Age of Onset   COPD Mother    Hyperlipidemia Mother    Hypertension Mother    Stroke Mother     Diabetes Mother    Heart disease Mother    Obesity Mother    Hyperlipidemia Father    Hypertension Father    Stroke Father    Diabetes Father    Sleep apnea Father    Obesity Father    Hypertension Sister    Asthma Brother    Hyperlipidemia Brother    Hypertension Brother    Cancer Maternal Grandmother    COPD Maternal Grandfather    Hyperlipidemia Maternal Grandfather    Cancer Paternal Grandmother    Stroke Paternal Grandfather     Outpatient Encounter Medications as of 10/10/2022  Medication Sig   Bempedoic Acid-Ezetimibe (NEXLIZET) 180-10 MG TABS Take by mouth.   cyanocobalamin (VITAMIN B12) 500 MCG tablet Take 1 tablet (500 mcg total) by mouth daily.   furosemide (LASIX) 20 MG tablet Take  20 mg by mouth daily.   icosapent Ethyl (VASCEPA) 1 g capsule Take 2 g by mouth 2 (two) times daily.   loratadine (CLARITIN) 10 MG tablet Take 10 mg by mouth daily.   Magnesium 250 MG TABS May use CALM supplement- 325mg  mag 1-twice daily   olmesartan (BENICAR) 40 MG tablet Take 40 mg by mouth daily.   potassium chloride (KLOR-CON) 10 MEQ tablet Take 10 mEq by mouth 2 (two) times daily.   rosuvastatin (CRESTOR) 20 MG tablet Take 20 mg by mouth daily.   tirzepatide (MOUNJARO) 10 MG/0.5ML Pen Inject 10 mg into the skin once a week.   Vitamin D, Ergocalciferol, (DRISDOL) 1.25 MG (50000 UNIT) CAPS capsule Take 1 capsule (50,000 Units total) by mouth every 7 (seven) days.   [DISCONTINUED] Continuous Blood Gluc Sensor (DEXCOM G7 SENSOR) MISC    [DISCONTINUED] MOUNJARO 15 MG/0.5ML Pen Inject 15 mg into the skin once a week.   [DISCONTINUED] NOVOLOG FLEXPEN 100 UNIT/ML FlexPen Inject 5-11 Units into the skin 3 (three) times daily with meals.   [DISCONTINUED] Semaglutide (OZEMPIC, 1 MG/DOSE, [place]) Inject 1 mg into the skin once a week.   [DISCONTINUED] TRESIBA FLEXTOUCH 200 UNIT/ML FlexTouch Pen Inject 30 Units into the skin at bedtime.   Continuous Blood Gluc Sensor (DEXCOM G7 SENSOR) MISC Change  sensor every 10 days as directed.   NOVOLOG FLEXPEN 100 UNIT/ML FlexPen Inject 5-11 Units into the skin 3 (three) times daily with meals.   TRESIBA FLEXTOUCH 200 UNIT/ML FlexTouch Pen Inject 30 Units into the skin at bedtime.   [DISCONTINUED] MOUNJARO 12.5 MG/0.5ML Pen SMARTSIG:0.5 Milliliter(s) SUB-Q Once a Week (Patient not taking: Reported on 10/10/2022)   No facility-administered encounter medications on file as of 10/10/2022.    ALLERGIES: Allergies  Allergen Reactions   Food     shellfish   Iodine     REACTION: swelling, hives, tachypnea   Metformin And Related Diarrhea    VACCINATION STATUS: Immunization History  Administered Date(s) Administered   Influenza Whole 04/10/2007    Diabetes She presents for her initial diabetic visit. She has type 2 diabetes mellitus. Onset time: diagnosed at approx age of 19. Her disease course has been improving. Hypoglycemia symptoms include nervousness/anxiousness, sweats and tremors. Associated symptoms include weight loss. There are no diabetic complications. Risk factors for coronary artery disease include diabetes mellitus, dyslipidemia, family history, obesity, hypertension and sedentary lifestyle. Current diabetic treatment includes intensive insulin program (and Mounjaro/Ozempic). She is compliant with treatment most of the time (has had trouble getting Mounjaro due to supply issues). Her weight is decreasing steadily. She is following a generally healthy diet. Meal planning includes ADA exchanges, avoidance of concentrated sweets, carbohydrate counting and calorie counting. She has had a previous visit with a dietitian. She rarely participates in exercise. (She presents today for her consultation with no meter or logs to review.  Her POCT A1c today is 5.5%, improving.  She notes she drinks mostly water (will drink a ICE flavored water drink occasionally), eats 3 meals per day and occasionally snacks on popcorn.  Her diet is limited due to  recommendations of [facility].  She does not engage in routine physical activity recently as she hurt her knee and she also had blister on toe which has since healed.  She does have plans to restart.  She is UTD on eye exam, does see podiatry routinely.) An ACE inhibitor/angiotensin II receptor blocker is being taken. She sees a podiatrist.Eye exam is current.     Review  of systems  Constitutional: + Minimally fluctuating body weight, current Body mass index is 43.29 kg/m., no fatigue, no subjective hyperthermia, no subjective hypothermia Eyes: no blurry vision, no xerophthalmia ENT: no sore throat, no nodules palpated in throat, no dysphagia/odynophagia, no hoarseness Cardiovascular: no chest pain, no shortness of breath, no palpitations, no leg swelling Respiratory: no cough, no shortness of breath Gastrointestinal: no nausea/vomiting/diarrhea Musculoskeletal: no muscle/joint aches Skin: no rashes, no hyperemia Neurological: no tremors, no numbness, no tingling, no dizziness Psychiatric: no depression, no anxiety  Objective:     BP 110/80 (BP Location: Right Arm, Patient Position: Sitting, Cuff Size: Large)   Pulse 66   Ht 5\' 3"  (1.6 m)   Wt 244 lb 6.4 oz (110.9 kg)   BMI 43.29 kg/m   Wt Readings from Last 3 Encounters:  10/10/22 244 lb 6.4 oz (110.9 kg)  10/04/22 242 lb (109.8 kg)  09/13/22 247 lb 6.4 oz (112.2 kg)     BP Readings from Last 3 Encounters:  10/10/22 110/80  10/04/22 110/75  09/13/22 112/79     Physical Exam- Limited  Constitutional:  Body mass index is 43.29 kg/m. , not in acute distress, normal state of mind Eyes:  EOMI, no exophthalmos Neck: Supple Cardiovascular: RRR, no murmurs, rubs, or gallops, no edema Respiratory: Adequate breathing efforts, no crackles, rales, rhonchi, or wheezing Musculoskeletal: no gross deformities, strength intact in all four extremities, no gross restriction of joint movements Skin:  no rashes, no hyperemia Neurological: no  tremor with outstretched hands    CMP ( most recent) CMP     Component Value Date/Time   NA 140 06/06/2022 0000   K 3.9 06/06/2022 0000   CL 98 (A) 06/06/2022 0000   CO2 28 07/15/2012 1058   GLUCOSE 120 (H) 07/15/2012 1058   BUN 12 06/06/2022 0000   CREATININE 0.7 06/06/2022 0000   CREATININE 0.77 07/15/2012 1058   CALCIUM 10.4 06/06/2022 0000   PROT 7.1 03/28/2007 2157   ALBUMIN 4.6 06/06/2022 0000   AST 33 06/06/2022 0000   ALT 29 06/06/2022 0000   ALKPHOS 100 06/06/2022 0000   BILITOT 0.5 03/28/2007 2157     Diabetic Labs (most recent): Lab Results  Component Value Date   HGBA1C 5.5 10/10/2022   HGBA1C 8.6 06/06/2022   HGBA1C 5.5 07/01/2006     Lipid Panel ( most recent) Lipid Panel     Component Value Date/Time   CHOL 134 07/12/2022 1055   TRIG 381 (H) 07/12/2022 1055   HDL 43 07/12/2022 1055   CHOLHDL 4.7 Ratio 03/28/2007 2157   VLDL NOT CALC mg/dL 96/10/5407 8119   LDLCALC 36 07/12/2022 1055   LABVLDL 55 (H) 07/12/2022 1055      Lab Results  Component Value Date   TSH 3.110 07/12/2022   FREET4 1.10 07/12/2022           Assessment & Plan:   1) Type 2 diabetes mellitus without complication, with long-term current use of insulin  She presents today for her consultation with no meter or logs to review.  Her POCT A1c today is 5.5%, improving.  She notes she drinks mostly water (will drink a ICE flavored water drink occasionally), eats 3 meals per day and occasionally snacks on popcorn.  Her diet is limited due to recommendations of [facility]WW.  She does not engage in routine physical activity recently as she hurt her knee and she also had blister on toe which has since healed.  She does have plans to  restart.  She is UTD on eye exam, does see podiatry routinely.  - BRUNETTA HUSBY has currently uncontrolled symptomatic type 2 DM since 52 years of age, with most recent A1c of 5.5 %.   -Recent labs reviewed.  - I had a long discussion with her about the  progressive nature of diabetes and the pathology behind its complications. -her diabetes is not currently complicated but she remains at a high risk for more acute and chronic complications which include CAD, CVA, CKD, retinopathy, and neuropathy. These are all discussed in detail with her.  The following Lifestyle Medicine recommendations according to American College of Lifestyle Medicine [facility]) were discussed and offered to patient and she agrees to start the journey:  A. Whole Foods, Plant-based plate comprising of fruits and vegetables, plant-based proteins, whole-grain carbohydrates was discussed in detail with the patient.   A list for source of those nutrients were also provided to the patient.  Patient will use only water or unsweetened tea for hydration. B.  The need to stay away from risky substances including alcohol, smoking; obtaining 7 to 9 hours of restorative sleep, at least 150 minutes of moderate intensity exercise weekly, the importance of healthy social connections,  and stress reduction techniques were discussed. C.  A full color page of  Calorie density of various food groups per pound showing examples of each food groups was provided to the patient.  - I have counseled her on diet and weight management by adopting a carbohydrate restricted/protein rich diet. Patient is encouraged to switch to unprocessed or minimally processed complex starch and increased protein intake (animal or plant source), fruits, and vegetables. -  she is advised to stick to a routine mealtimes to eat 3 meals a day and avoid unnecessary snacks (to snack only to correct hypoglycemia).   - she acknowledges that there is a room for improvement in her food and drink choices. - Suggestion is made for her to avoid simple carbohydrates from her diet including Cakes, Sweet Desserts, Ice Cream, Soda (diet and regular), Sweet Tea, Candies, Chips, Cookies, Store Bought Juices, Alcohol in Excess of 1-2 drinks a day,  Artificial Sweeteners, Coffee Creamer, and "Sugar-free" Products. This will help patient to have more stable blood glucose profile and potentially avoid unintended weight gain.  - I have approached her with the following individualized plan to manage her diabetes and patient agrees:   -After speaking with the patient today in great detail, I believe she is significantly over-insulinized.  She will benefit most from reduction in her insulin.  As such, I did decrease her Tresiba to 30 units SQ nightly (had previously been taking 74-80 BID), and lower her Novolog to 5-11 units TID with meals if glucose is above 90 and she is eating (Specific instructions on how to titrate insulin dosage based on glucose readings given to patient in writing).  She demonstrated her ability to properly use the SSI chart to dose insulin with me today.  I do anticipate her glucose to rebound, but if she follows the WFPB lifestyle diet plan, it should level back out with current insulin doses.  -she is encouraged to continue monitoring glucose 4 times daily (using her CGM-sent in refill today), before meals and before bed, and to call the clinic if she has readings less than 70 or above 200 for 3 tests in a row.  - she is warned not to take insulin without proper monitoring per orders. - Adjustment parameters are given to her  for hypo and hyperglycemia in writing.  - she is advised to continue Mounjaro 10 mg SQ weekly, therapeutically suitable for patient (has previously been on higher doses but had trouble finding the higher doses).  She can use up her current Ozempic 1 mg supply in the interim.  -She has been on several other medications for diabetes and did not tolerate them including: Invokana (yeast infections), Afrezza (chronic cough), Victoza (ineffective), Metformin (GI issues).  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  I do not feel this is impacting  her diabetes from the available chart notes.  - Specific targets for  A1c; LDL, HDL, and Triglycerides were discussed with the patient.  2) Blood Pressure /Hypertension:  her blood pressure is controlled to target.   she is advised to continue her current medications including Benicar 40 mg p.o. daily with breakfast and Lasix 20 mg po daily.  3) Lipids/Hyperlipidemia:    Review of her recent lipid panel from 08/08/22 showed controlled LDL at 35 and elevated triglycerides of 203 (improving) .  she is advised to continue Nexlizet 180-10 mg mg daily at bedtime and Crestor 20 mg po daily.  Side effects and precautions discussed with her.  4)  Weight/Diet:  her Body mass index is 43.29 kg/m.  -  clearly complicating her diabetes care.   she is a candidate for weight loss. I discussed with her the fact that loss of 5 - 10% of her  current body weight will have the most impact on her diabetes management.  Exercise, and detailed carbohydrates information provided  -  detailed on discharge instructions.  5) Chronic Care/Health Maintenance: -she is on ACEI/ARB and Statin medications and is encouraged to initiate and continue to follow up with Ophthalmology, Dentist, Podiatrist at least yearly or according to recommendations, and advised to stay away from smoking. I have recommended yearly flu vaccine and pneumonia vaccine at least every 5 years; moderate intensity exercise for up to 150 minutes weekly; and sleep for at least 7 hours a day.  - she is advised to maintain close follow up with Benita Stabile, MD for primary care needs, as well as her other providers for optimal and coordinated care.   - Time spent in this patient care: 60 min, of which > 50% was spent in counseling her about her diabetes and the rest reviewing her blood glucose logs, discussing her hypoglycemia and hyperglycemia episodes, reviewing her current and previous labs/studies (including abstraction from other facilities) and medications  doses and developing a long term treatment plan based on the latest standards of care/guidelines; and documenting her care.    Please refer to Patient Instructions for Blood Glucose Monitoring and Insulin/Medications Dosing Guide" in media tab for additional information. Please also refer to "Patient Self Inventory" in the Media tab for reviewed elements of pertinent patient history.  Loretta Luna participated in the discussions, expressed understanding, and voiced agreement with the above plans.  All questions were answered to her satisfaction. she is encouraged to contact clinic should she have any questions or concerns prior to her return visit.     Follow up plan: - Return in about 3 months (around 01/09/2023) for Diabetes F/U with A1c in office, No previsit labs, Bring meter and logs.    Ronny Bacon, [facility] [address] [place], [place] [address] Phone: [phone] Fax: [phone]  10/10/2022, 12:02 PM

## 2022-10-10 NOTE — Patient Instructions (Signed)

## 2022-10-11 DIAGNOSIS — M79676 Pain in unspecified toe(s): Secondary | ICD-10-CM | POA: Diagnosis not present

## 2022-10-11 DIAGNOSIS — L84 Corns and callosities: Secondary | ICD-10-CM | POA: Diagnosis not present

## 2022-10-11 DIAGNOSIS — E1142 Type 2 diabetes mellitus with diabetic polyneuropathy: Secondary | ICD-10-CM | POA: Diagnosis not present

## 2022-10-11 DIAGNOSIS — B351 Tinea unguium: Secondary | ICD-10-CM | POA: Diagnosis not present

## 2022-10-12 DIAGNOSIS — I10 Essential (primary) hypertension: Secondary | ICD-10-CM | POA: Diagnosis not present

## 2022-10-12 DIAGNOSIS — E7849 Other hyperlipidemia: Secondary | ICD-10-CM | POA: Diagnosis not present

## 2022-10-12 DIAGNOSIS — M7711 Lateral epicondylitis, right elbow: Secondary | ICD-10-CM | POA: Diagnosis not present

## 2022-10-12 DIAGNOSIS — E1169 Type 2 diabetes mellitus with other specified complication: Secondary | ICD-10-CM | POA: Diagnosis not present

## 2022-10-22 ENCOUNTER — Encounter (INDEPENDENT_AMBULATORY_CARE_PROVIDER_SITE_OTHER): Payer: Self-pay | Admitting: Family Medicine

## 2022-10-22 ENCOUNTER — Ambulatory Visit (INDEPENDENT_AMBULATORY_CARE_PROVIDER_SITE_OTHER): Payer: BC Managed Care – PPO | Admitting: Family Medicine

## 2022-10-22 ENCOUNTER — Telehealth (INDEPENDENT_AMBULATORY_CARE_PROVIDER_SITE_OTHER): Payer: BC Managed Care – PPO | Admitting: Family Medicine

## 2022-10-22 DIAGNOSIS — E1159 Type 2 diabetes mellitus with other circulatory complications: Secondary | ICD-10-CM | POA: Diagnosis not present

## 2022-10-22 DIAGNOSIS — Z7985 Long-term (current) use of injectable non-insulin antidiabetic drugs: Secondary | ICD-10-CM

## 2022-10-22 DIAGNOSIS — Z6841 Body Mass Index (BMI) 40.0 and over, adult: Secondary | ICD-10-CM

## 2022-10-22 DIAGNOSIS — I152 Hypertension secondary to endocrine disorders: Secondary | ICD-10-CM

## 2022-10-22 DIAGNOSIS — Z794 Long term (current) use of insulin: Secondary | ICD-10-CM

## 2022-10-22 DIAGNOSIS — E1169 Type 2 diabetes mellitus with other specified complication: Secondary | ICD-10-CM | POA: Diagnosis not present

## 2022-10-22 NOTE — Progress Notes (Incomplete)
[name], D.O.  ABFM, ABOM Specializing in Clinical Bariatric Medicine  Office located at: [address] [place], [place]  [address]     Assessment and Plan:   No orders of the defined types were placed in this encounter.   There are no discontinued medications.   No orders of the defined types were placed in this encounter.    ***   TREATMENT PLAN FOR OBESITY:  Assessment: Condition is {docourse:29403:::1}. {BiometricData (Optional):29179} Fat mass has {DID:29233} by ***lb. Muscle mass has {DID:29233} by ***lb. Total body water has {DID:29233} by ***lb.   Plan:  Angeletta is currently in the action stage of change. As such, her goal is to continue weight management plan. Orel will work on healthier eating habits and try their best to follow the {mealplan:29239} best they can.   Behavioral Intervention Additional resources provided today: {weightresources:29185} Evidence-based interventions for health behavior change were utilized today including the discussion of self monitoring techniques, problem-solving barriers and SMART goal setting techniques.   Regarding patient's less desirable eating habits and patterns, we employed the technique of small changes.  Pt will specifically work on: *** for next visit.    Recommended Physical Activity Goals Lainey has been advised to work up to 150 minutes of moderate intensity aerobic activity a week and strengthening exercises 2-3 times per week for cardiovascular health, weight loss maintenance and preservation of muscle mass.  She has agreed to {EMEXERCISE:28847}  Pharmacotherapy We discussed various medication options to help Patrycja with her weight loss efforts and we both agreed to ***.   FOLLOW UP: No follow-ups on file.*** She was informed of the importance of frequent follow up visits to maximize her success with intensive lifestyle modifications for her multiple health conditions.   Subjective:   Chief  complaint: Obesity Andie is here to discuss her progress with her obesity treatment plan. She is on the {MWMwtlossportion/plan2:23431} and states she is following her eating plan approximately ***% of the time. She states she is exercising *** minutes *** days per week.  Interval History:  PREETHI SCANTLEBURY is here for a follow up office visit. Since last office visit she ***  We reviewed her meal plan and all questions were answered. Patient's food recall appears to be accurate and consistent with what is on plan when she is following it. When eating on plan, her hunger and cravings are well controlled.      Pharmacotherapy for weight loss: She {srtis (Optional):29129} currently taking {srtpreviousweightlossmeds (Optional):29124} for medical weight loss.  Denies side effects.    Review of Systems:  Pertinent positives were addressed with patient today.   Weight Summary and Biometrics   No data recorded No data recorded ***  No data recorded No data recorded No data recorded No data recorded   Objective:   PHYSICAL EXAM: There were no vitals taken for this visit. There is no height or weight on file to calculate BMI.  General: Well Developed, well nourished, and in no acute distress.  HEENT: Normocephalic, atraumatic Skin: Warm and dry, cap RF less 2 sec, good turgor Chest:  Normal excursion, shape, no gross abn Respiratory: speaking in full sentences, no conversational dyspnea NeuroM-Sk: Ambulates w/o assistance, moves * 4 Psych: A and O *3, insight good, mood-full  DIAGNOSTIC DATA REVIEWED:  BMET    Component Value Date/Time   NA 140 06/06/2022 0000   K 3.9 06/06/2022 0000   CL 98 (A) 06/06/2022 0000   CO2 28 07/15/2012  1058   GLUCOSE 120 (H) 07/15/2012 1058   BUN 12 06/06/2022 0000   CREATININE 0.7 06/06/2022 0000   CREATININE 0.77 07/15/2012 1058   CALCIUM 10.4 06/06/2022 0000   Lab Results  Component Value Date   HGBA1C 5.5 10/10/2022   HGBA1C 5.5 07/01/2006    Lab Results  Component Value Date   INSULIN 17.4 07/12/2022   Lab Results  Component Value Date   TSH 3.110 07/12/2022   CBC    Component Value Date/Time   WBC 7.2 06/06/2022 0000   WBC 8.2 07/15/2012 1058   RBC 5.81 (A) 06/06/2022 0000   HGB 15.8 06/06/2022 0000   HCT 49 (A) 06/06/2022 0000   PLT 317 06/06/2022 0000   MCV 78.0 07/15/2012 1058   MCH 26.3 07/15/2012 1058   MCHC 33.7 07/15/2012 1058   RDW 15.5 07/15/2012 1058   Iron Studies No results found for: "IRON", "TIBC", "FERRITIN", "IRONPCTSAT" Lipid Panel     Component Value Date/Time   CHOL 134 07/12/2022 1055   TRIG 381 (H) 07/12/2022 1055   HDL 43 07/12/2022 1055   CHOLHDL 4.7 Ratio 03/28/2007 2157   VLDL NOT CALC mg/dL 82/95/6213 0865   LDLCALC 36 07/12/2022 1055   Hepatic Function Panel     Component Value Date/Time   PROT 7.1 03/28/2007 2157   ALBUMIN 4.6 06/06/2022 0000   AST 33 06/06/2022 0000   ALT 29 06/06/2022 0000   ALKPHOS 100 06/06/2022 0000   BILITOT 0.5 03/28/2007 2157      Component Value Date/Time   TSH 3.110 07/12/2022 1055   Nutritional Lab Results  Component Value Date   VD25OH 18.0 (L) 07/12/2022    Attestations:   Reviewed by clinician on day of visit: allergies, medications, problem list, medical history, surgical history, family history, social history, and previous encounter notes.   Patient was in the office today and time spent on visit including pre-visit chart review and post-visit care/coordination of care and electronic medical record documentation was *** minutes. 50% of the time was in face to face counseling of this patient's medical condition(s) and providing education on treatment options to include the first-line treatment of diet and lifestyle modification.   I,Special Puri,acting as a Neurosurgeon for [employer], DO.,have documented all relevant documentation on the behalf of Thomasene Lot, DO,as directed by  Thomasene Lot, DO while in the presence of  Thomasene Lot, DO.   I, Thomasene Lot, DO, have reviewed all documentation for this visit. The documentation on 10/22/22 for the exam, diagnosis, procedures, and orders are all accurate and complete.

## 2022-10-22 NOTE — Progress Notes (Signed)
TeleHealth Visit:  This visit was completed with telemedicine (audio/video) technology. Loretta Luna has verbally consented to this TeleHealth visit. The patient is located at home, the provider is located at home. The participants in this visit include the listed provider and patient. The visit was conducted today via MyChart video.  OBESITY Loretta Luna is here to discuss her progress with her obesity treatment plan along with follow-up of her obesity related diagnoses.   Today's visit was # 7 Starting weight: 254 lbs Starting date: 07/12/22 Weight at last in office visit: 242 lbs on 10/04/22 Total weight loss: 12 lbs at last in office visit on 10/04/22. Today's reported weight (10/22/22):  243.4 lbs  Nutrition Plan: the Category 3 plan  Current exercise:  walking 10 minutes 3 days per week.   Interim History:  She does not want to eat the bread on the plan due to her diabetes. She does not feel that the [facility] helps much with her hunger.  She has been struggling with getting hungry in between meals. 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  Eating all of the prescribed protein: yes Skipping meals: No Drinking adequate water: Yes Drinking sugar sweetened beverages: No Hunger controlled: poorly controlled.   Assessment/Plan:  1. Type 2 Diabetes Mellitus with other specified complication, with long-term current use of insulin HgbA1c is at goal. Last A1c was 5.5 on  10/10/22.  CBGs: Fasting average 134 . Only one reading > 200. Average 150-178.  Loretta Luna is nervous because sugars are higher than they had been before but NP at endocrinology told her to expect this because she dropped her insulin dosages. Episodes of hypoglycemia: no Medication(s): Mounjaro 10 mg SQ weekly, Tresiba 30 units daily, NovaLog 5-11 units three x daily with meals. Managed by [facility] Endo- Ronny Bacon, NP.  Patient recently had her first visit with her.  Lab Results  Component Value  Date   HGBA1C 5.5 10/10/2022   HGBA1C 8.6 06/06/2022   HGBA1C 5.5 07/01/2006   Lab Results  Component Value Date   LDLCALC 36 07/12/2022   CREATININE 0.7 06/06/2022   No results found for: "GFR"  Plan: Continue all medications at current dosages. Switch to the low-carb plan. Message Ronny Bacon, NP if she continues to be concerned about CBG readings.   2. Hypertension associated with type 2 diabetes Hypertension well controlled.  Has had some issues with low blood pressure recently.  Dr. Hilty/cardiology discontinued chlorthalidone.  Last few readings have been excellent.  Medications: Lasix 20 mg daily, olmesartan 40 mg daily. BP Readings from Last 3 Encounters:  10/10/22 110/80  10/04/22 110/75  09/13/22 112/79   Lab Results  Component Value Date   CREATININE 0.7 06/06/2022   CREATININE 0.77 07/15/2012   CREATININE 0.86 03/28/2007   No results found for: "GFR"  Plan: Continue all antihypertensives at current dosages. Follow-up with cardiology as directed.  3.  Morbid Obesity: Current BMI 42 Loretta Luna is currently in the action stage of change. As such, her goal is to continue with weight loss efforts.  She has agreed to low carbohydrate plan.  She feels the low-carb plan suits the way that she likes to eat better.  She is excited about starting it. Low-carb plan sent via MyChart.  Exercise goals: Increase walking to 15 minutes 3 days/week.  Behavioral modification strategies: increasing lean protein intake, decreasing simple carbohydrates , meal planning , and planning for success.  Loretta Luna has agreed to follow-up with our clinic in  4 weeks.   No orders of the defined types were placed in this encounter.   There are no discontinued medications.   No orders of the defined types were placed in this encounter.     Objective:   VITALS: Per patient if applicable, see vitals. GENERAL: Alert and in no acute distress. CARDIOPULMONARY: No increased WOB. Speaking  in clear sentences.  PSYCH: Pleasant and cooperative. Speech normal rate and rhythm. Affect is appropriate. Insight and judgement are appropriate. Attention is focused, linear, and appropriate.  NEURO: Oriented as arrived to appointment on time with no prompting.   Attestation Statements:   Reviewed by clinician on day of visit: allergies, medications, problem list, medical history, surgical history, family history, social history, and previous encounter notes.  Total time spent on day of encounter: 38 minutes -  Jesse Sans, FNP  May include pre-charting, obtaining history and exam, counseling patient and family, ordering tests and medications, referring and communicating with other HCP, documenting,   This was prepared with the assistance of Engineer, civil (consulting).  Occasional wrong-word or sound-a-like substitutions may have occurred due to the inherent limitations of voice recognition software.

## 2022-11-09 ENCOUNTER — Encounter: Payer: Self-pay | Admitting: Nurse Practitioner

## 2022-11-20 ENCOUNTER — Encounter (INDEPENDENT_AMBULATORY_CARE_PROVIDER_SITE_OTHER): Payer: Self-pay | Admitting: Physician Assistant

## 2022-11-20 ENCOUNTER — Ambulatory Visit (INDEPENDENT_AMBULATORY_CARE_PROVIDER_SITE_OTHER): Payer: BC Managed Care – PPO | Admitting: Physician Assistant

## 2022-11-20 VITALS — BP 115/78 | HR 76 | Temp 97.9°F | Ht 63.0 in | Wt 236.0 lb

## 2022-11-20 DIAGNOSIS — E1169 Type 2 diabetes mellitus with other specified complication: Secondary | ICD-10-CM | POA: Diagnosis not present

## 2022-11-20 DIAGNOSIS — E559 Vitamin D deficiency, unspecified: Secondary | ICD-10-CM

## 2022-11-20 DIAGNOSIS — Z794 Long term (current) use of insulin: Secondary | ICD-10-CM | POA: Diagnosis not present

## 2022-11-20 DIAGNOSIS — Z6841 Body Mass Index (BMI) 40.0 and over, adult: Secondary | ICD-10-CM

## 2022-11-20 DIAGNOSIS — I152 Hypertension secondary to endocrine disorders: Secondary | ICD-10-CM

## 2022-11-20 DIAGNOSIS — E1159 Type 2 diabetes mellitus with other circulatory complications: Secondary | ICD-10-CM

## 2022-11-20 DIAGNOSIS — E669 Obesity, unspecified: Secondary | ICD-10-CM

## 2022-11-20 DIAGNOSIS — E119 Type 2 diabetes mellitus without complications: Secondary | ICD-10-CM | POA: Insufficient documentation

## 2022-11-20 DIAGNOSIS — E785 Hyperlipidemia, unspecified: Secondary | ICD-10-CM

## 2022-11-20 DIAGNOSIS — E538 Deficiency of other specified B group vitamins: Secondary | ICD-10-CM

## 2022-11-20 DIAGNOSIS — Z7985 Long-term (current) use of injectable non-insulin antidiabetic drugs: Secondary | ICD-10-CM

## 2022-11-20 NOTE — Progress Notes (Signed)
.smr  Office: [phone]  /  Fax: [phone]  WEIGHT SUMMARY AND BIOMETRICS  Vitals Temp: 97.9 F (36.6 C) BP: 115/78 Pulse Rate: 76 SpO2: 97 %   Anthropometric Measurements Height: 5\' 3"  (1.6 m) Weight: 236 lb (107 kg) BMI (Calculated): 41.82 Weight at Last Visit: 242 lb Weight Lost Since Last Visit: 6 lb Weight Gained Since Last Visit: 0 Starting Weight: 254 lb   Body Composition  Body Fat %: 46.9 % Fat Mass (lbs): 110.6 lbs Muscle Mass (lbs): 119 lbs Total Body Water (lbs): 84.6 lbs Visceral Fat Rating : 15   Other Clinical Data Labs: No Today's Visit #: 7 Starting Date: 07/12/22     HPI  Chief Complaint: OBESITY  Loretta Luna is here to discuss her progress with her obesity treatment plan. She is on the the Category 3 Plan and states she is following her eating plan approximately 40 % of the time. She states she is exercising/walking 20-30 minutes 2-3 times per week.   Interval History:  Since last office visit she down 6 lbs.  Down total of 18 lbs since Jan.  TBW loss of 7.1%  Hunger/appetite-Generally controlled- feels very hungry some days. Not skipping meals.  Cravings- denies excessive cravings Stress- good overall, she is working more hours due to decreased staffing Sleep- maybe a little better since she has lost weight Exercise-walking 20-30 minutes 2-3 times weekly and plans to start pool walking with a friend 2-3 times weekly Hydration-Adequate.    Pharmacotherapy: Mounjaro 10 mg weekly. Denies mass in neck, dysphagia, dyspepsia, persistent hoarseness, abdominal pain, or N/V/Constipation or diarrhea. Has annual eye exam. Mood is stable. Prescribed by Endocrinology- Ronny Bacon, NP  TREATMENT PLAN FOR OBESITY:  Recommended Dietary Goals  Loretta Luna is currently in the action stage of change. As such, her goal is to continue weight management plan. She has agreed to the Category 3 Plan.  Behavioral Intervention  We discussed the following  Behavioral Modification Strategies today: increasing lean protein intake, decreasing simple carbohydrates , increasing vegetables, increasing lower glycemic fruits, increasing fiber rich foods, avoiding skipping meals, increasing water intake, continue to practice mindfulness when eating, and planning for success.  Additional resources provided today: 100 calorie protein snacks  Recommended Physical Activity Goals  Loretta Luna has been advised to work up to 150 minutes of moderate intensity aerobic activity a week and strengthening exercises 2-3 times per week for cardiovascular health, weight loss maintenance and preservation of muscle mass.   She has agreed to Continue current level of physical activity  and Increase the intensity, frequency or duration of aerobic exercises     Pharmacotherapy We discussed various medication options to help Loretta Luna with her weight loss efforts and we both agreed to continue [facility] for Type 2 diabetes.  She was informed we would discuss her lab results at her next visit unless there is a critical issue that needs to be addressed sooner. She agreed to keep her next visit at the agreed upon time to discuss these results.     Return in about 2 weeks (around 12/04/2022).Marland Kitchen She was informed of the importance of frequent follow up visits to maximize her success with intensive lifestyle modifications for her multiple health conditions.  PHYSICAL EXAM:  Blood pressure 115/78, pulse 76, temperature 97.9 F (36.6 C), height 5\' 3"  (1.6 m), weight 236 lb (107 kg), SpO2 97 %. Body mass index is 41.81 kg/m.  General: She is overweight, cooperative, alert, well developed, and in no acute distress. PSYCH: Has normal  mood, affect and thought process.   Cardiovascular: HR 70's regular Lungs: Normal breathing effort, no conversational dyspnea. Neuro: no focal deficits  DIAGNOSTIC DATA REVIEWED:  BMET    Component Value Date/Time   NA 140 06/06/2022 0000   K 3.9 06/06/2022  0000   CL 98 (A) 06/06/2022 0000   CO2 28 07/15/2012 1058   GLUCOSE 120 (H) 07/15/2012 1058   BUN 12 06/06/2022 0000   CREATININE 0.7 06/06/2022 0000   CREATININE 0.77 07/15/2012 1058   CALCIUM 10.4 06/06/2022 0000   Lab Results  Component Value Date   HGBA1C 5.5 10/10/2022   HGBA1C 5.5 07/01/2006   Lab Results  Component Value Date   INSULIN 17.4 07/12/2022   Lab Results  Component Value Date   TSH 3.110 07/12/2022   CBC    Component Value Date/Time   WBC 7.2 06/06/2022 0000   WBC 8.2 07/15/2012 1058   RBC 5.81 (A) 06/06/2022 0000   HGB 15.8 06/06/2022 0000   HCT 49 (A) 06/06/2022 0000   PLT 317 06/06/2022 0000   MCV 78.0 07/15/2012 1058   MCH 26.3 07/15/2012 1058   MCHC 33.7 07/15/2012 1058   RDW 15.5 07/15/2012 1058   Iron Studies No results found for: "IRON", "TIBC", "FERRITIN", "IRONPCTSAT" Lipid Panel     Component Value Date/Time   CHOL 134 07/12/2022 1055   TRIG 381 (H) 07/12/2022 1055   HDL 43 07/12/2022 1055   CHOLHDL 4.7 Ratio 03/28/2007 2157   VLDL NOT CALC mg/dL 40/98/1191 4782   LDLCALC 36 07/12/2022 1055   Hepatic Function Panel     Component Value Date/Time   PROT 7.1 03/28/2007 2157   ALBUMIN 4.6 06/06/2022 0000   AST 33 06/06/2022 0000   ALT 29 06/06/2022 0000   ALKPHOS 100 06/06/2022 0000   BILITOT 0.5 03/28/2007 2157      Component Value Date/Time   TSH 3.110 07/12/2022 1055   Nutritional Lab Results  Component Value Date   VD25OH 18.0 (L) 07/12/2022    ASSOCIATED CONDITIONS ADDRESSED TODAY  ASSESSMENT AND PLAN  Problem List Items Addressed This Visit     Hyperlipidemia associated with type 2 diabetes mellitus ([name])   Hypertension associated with diabetes ([name])   Vitamin D deficiency   Relevant Orders   VITAMIN D 25 Hydroxy (Vit-D Deficiency, Fractures)   B12 deficiency   Relevant Orders   Vitamin B12   BMI 40.0-44.9, adult ([name]), Current BMI 43.59   Obesity ([name])- Starting BMI 45.06/DATE 07/12/22   Diabetes  mellitus ([name]) - Primary   Relevant Orders   CMP14+EGFR   Type 2 Diabetes Mellitus with other specified complication, with long-term current use of insulin HgbA1c is at goal. Last A1c was 5.5- Much improved from 8.6!  CBGs: Fasting 150's Post prandial low 200's- Monitoring with Endocrinology.  Episodes of hypoglycemia: no  On Statin and ARB.  Medication(s): Mounjaro 10 mg SQ weekly Denies mass in neck, dysphagia, dyspepsia, persistent hoarseness, abdominal pain, or N/V/Constipation or diarrhea. Has annual eye exam. Mood is stable.   Tresiba 30 units at bedtime. Novolog SSI meal time coverage.    Lab Results  Component Value Date   HGBA1C 5.5 10/10/2022   HGBA1C 8.6 06/06/2022   HGBA1C 5.5 07/01/2006   Lab Results  Component Value Date   LDLCALC 36 07/12/2022   CREATININE 0.7 06/06/2022   No results found for: "GFR"  Plan: Continue Mounjaro 10 mg SQ weekly, Loretta Luna and Novolog coverage per Endocrinology.  Will follow with Endocrinology.  Nice improvement in A1C.  Continue working on nutrition plan to decrease simple carbohydrates, increase lean proteins and exercise to promote weight loss and improve glycemic control . Will recheck CMET today.   Hypertension Hypertension well controlled, stable, asymptomatic, and no significant medication side effects noted.  Medication(s): Benicar 40 mg daily.    Lasix 20 mg daily.   BP Readings from Last 3 Encounters:  11/20/22 115/78  10/10/22 110/80  10/04/22 110/75   Lab Results  Component Value Date   CREATININE 0.7 06/06/2022   CREATININE 0.77 07/15/2012   CREATININE 0.86 03/28/2007   No results found for: "GFR"  Plan: Continue all antihypertensives at current dosages. Continue to work on nutrition plan to promote weight loss and improve BP control.  Recheck CMET today.   Vitamin D Deficiency Vitamin D is not at goal of 50.  Most recent vitamin D level was 18.0. She is on  prescription ergocalciferol 50,000 IU  weekly. Lab Results  Component Value Date   VD25OH 18.0 (L) 07/12/2022    Plan: Continue  prescription ergocalciferol 50,000 IU weekly Recheck Vitamin D level today.  Low vitamin D levels can be associated with adiposity and may result in leptin resistance and weight gain. Also associated with fatigue. Currently on vitamin D supplementation without any adverse effects.   B 12 Deficiency:  Previous B 12 level 296. Endorsed fatigue. Started B 12 500 mcg daily.  Plan: Continue B 12 daily.   Recheck B 12 level today.    ATTESTASTION STATEMENTS:  Reviewed by clinician on day of visit: allergies, medications, problem list, medical history, surgical history, family history, social history, and previous encounter notes.   I have personally spent 42 minutes total time today in preparation, patient care, nutritional counseling and documentation for this visit, including the following: review of clinical lab tests; review of medical tests/procedures/services.      Hidaya Daniel, PA-C

## 2022-11-21 LAB — CMP14+EGFR
ALT: 26 IU/L (ref 0–32)
AST: 23 IU/L (ref 0–40)
Albumin/Globulin Ratio: 1.9 (ref 1.2–2.2)
Albumin: 4.5 g/dL (ref 3.8–4.9)
Alkaline Phosphatase: 93 IU/L (ref 44–121)
BUN/Creatinine Ratio: 25 — ABNORMAL HIGH (ref 9–23)
BUN: 16 mg/dL (ref 6–24)
Bilirubin Total: 0.3 mg/dL (ref 0.0–1.2)
CO2: 23 mmol/L (ref 20–29)
Calcium: 10.5 mg/dL — ABNORMAL HIGH (ref 8.7–10.2)
Chloride: 102 mmol/L (ref 96–106)
Creatinine, Ser: 0.65 mg/dL (ref 0.57–1.00)
Globulin, Total: 2.4 g/dL (ref 1.5–4.5)
Glucose: 118 mg/dL — ABNORMAL HIGH (ref 70–99)
Potassium: 4.4 mmol/L (ref 3.5–5.2)
Sodium: 142 mmol/L (ref 134–144)
Total Protein: 6.9 g/dL (ref 6.0–8.5)
eGFR: 105 mL/min/{1.73_m2} (ref >59)

## 2022-11-21 LAB — VITAMIN B12: Vitamin B-12: 536 pg/mL (ref 232–1245)

## 2022-11-21 LAB — VITAMIN D 25 HYDROXY (VIT D DEFICIENCY, FRACTURES): Vit D, 25-Hydroxy: 32.7 ng/mL (ref 30.0–100.0)

## 2022-11-29 ENCOUNTER — Ambulatory Visit: Payer: BC Managed Care – PPO | Admitting: Nurse Practitioner

## 2022-12-05 ENCOUNTER — Ambulatory Visit (INDEPENDENT_AMBULATORY_CARE_PROVIDER_SITE_OTHER): Payer: BC Managed Care – PPO | Admitting: Physician Assistant

## 2022-12-05 ENCOUNTER — Encounter (INDEPENDENT_AMBULATORY_CARE_PROVIDER_SITE_OTHER): Payer: Self-pay | Admitting: Physician Assistant

## 2022-12-05 VITALS — BP 110/75 | HR 77 | Temp 97.7°F | Ht 63.0 in | Wt 233.0 lb

## 2022-12-05 DIAGNOSIS — E1159 Type 2 diabetes mellitus with other circulatory complications: Secondary | ICD-10-CM

## 2022-12-05 DIAGNOSIS — E1169 Type 2 diabetes mellitus with other specified complication: Secondary | ICD-10-CM | POA: Diagnosis not present

## 2022-12-05 DIAGNOSIS — E785 Hyperlipidemia, unspecified: Secondary | ICD-10-CM

## 2022-12-05 DIAGNOSIS — Z6841 Body Mass Index (BMI) 40.0 and over, adult: Secondary | ICD-10-CM

## 2022-12-05 DIAGNOSIS — E538 Deficiency of other specified B group vitamins: Secondary | ICD-10-CM

## 2022-12-05 DIAGNOSIS — Z7985 Long-term (current) use of injectable non-insulin antidiabetic drugs: Secondary | ICD-10-CM

## 2022-12-05 DIAGNOSIS — E669 Obesity, unspecified: Secondary | ICD-10-CM

## 2022-12-05 DIAGNOSIS — E559 Vitamin D deficiency, unspecified: Secondary | ICD-10-CM

## 2022-12-05 DIAGNOSIS — Z794 Long term (current) use of insulin: Secondary | ICD-10-CM

## 2022-12-05 MED ORDER — VITAMIN D (ERGOCALCIFEROL) 1.25 MG (50000 UNIT) PO CAPS
50000.0000 [IU] | ORAL_CAPSULE | ORAL | 0 refills | Status: DC
Start: 2022-12-05 — End: 2022-12-27

## 2022-12-05 NOTE — Progress Notes (Unsigned)
s.smr  Office: [phone]  /  Fax: [phone]  WEIGHT SUMMARY AND BIOMETRICS  Vitals Temp: 97.7 F (36.5 C) BP: 110/75 Pulse Rate: 77 SpO2: 95 %   Anthropometric Measurements Height: 5\' 3"  (1.6 m) Weight: 233 lb (105.7 kg) BMI (Calculated): 41.28 Weight at Last Visit: 236 lb Weight Lost Since Last Visit: 3 lb Weight Gained Since Last Visit: 0 Starting Weight: 254 lb   Body Composition  Body Fat %: 46.9 % Fat Mass (lbs): 109.2 lbs Muscle Mass (lbs): 117.6 lbs Total Body Water (lbs): 83.6 lbs Visceral Fat Rating : 15   Other Clinical Data Fasting: Yes Labs: No Today's Visit #: 8 Starting Date: 07/12/22     HPI  Chief Complaint: OBESITY  Loretta Luna is here to discuss her progress with her obesity treatment plan. She is on the the Category 3 Plan and states she is following her eating plan approximately 60 % of the time. She states she is exercising/walking 22-25 minutes 5 times per week.   Interval History:  Since last office visit she down 3 lbs.  Hunger/appetite-Never feels fully satisfied after breakfast- Hungry again by 10:30 am and usually has snack- [place] yogurt Tzatziki dip and crackers or 100 cal pop corn.  Some challenges when eating out but quickly back on track with nutrition plan.   Cravings- pizza at times- Discussed trying alternate crusts to decrease carbohydrate content Stress- Very high with continued work demands, working over time. She is going to see if schedule can be changed decrease her stress levels and allow for some self care Sleep- Poor lately due to increased stress. Feels schedule change may help with this.  Exercise-20-25 minutes daily- walking Hydration-working on water intake.   Pharmacotherapy: Mounjaro 10 mg weekly. Denies mass in neck, dysphagia, dyspepsia, persistent hoarseness, abdominal pain, or N/V/Constipation or diarrhea. Has annual eye exam. Mood is stable.     TREATMENT PLAN FOR OBESITY:  Recommended Dietary  Goals  Loretta Luna is currently in the action stage of change. As such, her goal is to continue weight management plan. She has agreed to the Category 3 Plan.  Behavioral Intervention  We discussed the following Behavioral Modification Strategies today: increasing lean protein intake, decreasing simple carbohydrates , increasing vegetables, increasing lower glycemic fruits, avoiding skipping meals, increasing water intake, emotional eating strategies and understanding the difference between hunger signals and cravings, work on managing stress, creating time for self-care and relaxation measures, continue to practice mindfulness when eating, and planning for success.  Additional resources provided today: NA  Recommended Physical Activity Goals  Loretta Luna has been advised to work up to 150 minutes of moderate intensity aerobic activity a week and strengthening exercises 2-3 times per week for cardiovascular health, weight loss maintenance and preservation of muscle mass.   She has agreed to Continue current level of physical activity    Pharmacotherapy We discussed various medication options to help Loretta Luna with her weight loss efforts and we both agreed to continue Mounjaro 10 mg weekly for Type 2 diabetes.    Return in about 2 weeks (around 12/19/2022).Marland Kitchen She was informed of the importance of frequent follow up visits to maximize her success with intensive lifestyle modifications for her multiple health conditions.  PHYSICAL EXAM:  Blood pressure 110/75, pulse 77, temperature 97.7 F (36.5 C), height 5\' 3"  (1.6 m), weight 233 lb (105.7 kg), SpO2 95 %. Body mass index is 41.27 kg/m.  General: She is overweight, cooperative, alert, well developed, and in no acute distress. PSYCH: Has normal  mood, affect and thought process.   Cardiovascular: HR 70's regular Lungs: Normal breathing effort, no conversational dyspnea. Neuro: no focal deficits  DIAGNOSTIC DATA REVIEWED:  BMET    Component Value  Date/Time   NA 142 11/20/2022 0833   K 4.4 11/20/2022 0833   CL 102 11/20/2022 0833   CO2 23 11/20/2022 0833   GLUCOSE 118 (H) 11/20/2022 0833   GLUCOSE 120 (H) 07/15/2012 1058   BUN 16 11/20/2022 0833   CREATININE 0.65 11/20/2022 0833   CREATININE 0.77 07/15/2012 1058   CALCIUM 10.5 (H) 11/20/2022 0833   Lab Results  Component Value Date   HGBA1C 5.5 10/10/2022   HGBA1C 5.5 07/01/2006   Lab Results  Component Value Date   INSULIN 17.4 07/12/2022   Lab Results  Component Value Date   TSH 3.110 07/12/2022   CBC    Component Value Date/Time   WBC 7.2 06/06/2022 0000   WBC 8.2 07/15/2012 1058   RBC 5.81 (A) 06/06/2022 0000   HGB 15.8 06/06/2022 0000   HCT 49 (A) 06/06/2022 0000   PLT 317 06/06/2022 0000   MCV 78.0 07/15/2012 1058   MCH 26.3 07/15/2012 1058   MCHC 33.7 07/15/2012 1058   RDW 15.5 07/15/2012 1058   Iron Studies No results found for: "IRON", "TIBC", "FERRITIN", "IRONPCTSAT" Lipid Panel     Component Value Date/Time   CHOL 134 07/12/2022 1055   TRIG 381 (H) 07/12/2022 1055   HDL 43 07/12/2022 1055   CHOLHDL 4.7 Ratio 03/28/2007 2157   VLDL NOT CALC mg/dL 40/98/1191 4782   LDLCALC 36 07/12/2022 1055   Hepatic Function Panel     Component Value Date/Time   PROT 6.9 11/20/2022 0833   ALBUMIN 4.5 11/20/2022 0833   AST 23 11/20/2022 0833   ALT 26 11/20/2022 0833   ALKPHOS 93 11/20/2022 0833   BILITOT 0.3 11/20/2022 0833      Component Value Date/Time   TSH 3.110 07/12/2022 1055   Nutritional Lab Results  Component Value Date   VD25OH 32.7 11/20/2022   VD25OH 18.0 (L) 07/12/2022    ASSOCIATED CONDITIONS ADDRESSED TODAY  ASSESSMENT AND PLAN  Problem List Items Addressed This Visit     Hyperlipidemia associated with type 2 diabetes mellitus ([name])   Vitamin D deficiency   Relevant Medications   Vitamin D, Ergocalciferol, (DRISDOL) 1.25 MG (50000 UNIT) CAPS capsule   B12 deficiency   BMI 40.0-44.9, adult ([name]), Current BMI 43.59    Obesity ([name])- Starting BMI 45.06/DATE 07/12/22   Diabetes mellitus ([name]) - Primary   Type 2 Diabetes Mellitus with other specified complication, with long-term current use of insulin HgbA1c is at goal. Last A1c was 6.7 at Endocrinology appointment/ 5.5 in April.  CBGs: Fasting 190/ CGM showing consistently higher CBG lately with increased stress Episodes of hypoglycemia: no Medication(s): Mounjaro 10 mg SQ weekly Denies mass in neck, dysphagia, dyspepsia, persistent hoarseness, abdominal pain, or N/V/Constipation or diarrhea. Has annual eye exam. Mood is stable.    Tresiba 30 units daily and Novolog SSI for meal time.   Lab Results  Component Value Date   HGBA1C 5.5 10/10/2022   HGBA1C 8.6 06/06/2022   HGBA1C 5.5 07/01/2006   Lab Results  Component Value Date   LDLCALC 36 07/12/2022   CREATININE 0.65 11/20/2022   No results found for: "GFR"  Plan: Continue Mounjaro 10 mg SQ weekly and Tresiba 30 units daily and Novolog SSI generally 5 units meal coverage currently. She is following regularly with Endocrinology and  has follow up  July17th. Will follow with Endocrinology.   Hyperlipidemia LDL is at goal. Triglycerides not at goal. HDL 43-  Medication(s): Crestor 20 mg daily and Vascepa 2 grams twice daily. No side effects with medications.  Cardiovascular risk factors: diabetes mellitus, dyslipidemia, hypertension, obesity (BMI >= 30 kg/m2), and sedentary lifestyle  Lab Results  Component Value Date   CHOL 134 07/12/2022   HDL 43 07/12/2022   LDLCALC 36 07/12/2022   TRIG 381 (H) 07/12/2022   CHOLHDL 4.7 Ratio 03/28/2007   CHOLHDL 4.1 07/01/2006   Lab Results  Component Value Date   ALT 26 11/20/2022   AST 23 11/20/2022   ALKPHOS 93 11/20/2022   BILITOT 0.3 11/20/2022   The ASCVD Risk score (Arnett DK, et al., 2019) failed to calculate for the following reasons:   The valid total cholesterol range is 130 to 320 mg/dL  Plan: Continue Crestor and Vascepa.  Continue to  work on nutrition plan -decreasing simple carbohydrates, increasing lean proteins, decreasing saturated fats and cholesterol , avoiding trans fats and exercise as able to promote weight loss, improve lipids and decrease cardiovascular risks.  Vitamin D Deficiency Vitamin D is not at goal of 50.  Most recent vitamin D level was 32.7. She is on  prescription ergocalciferol 50,000 IU weekly. Lab Results  Component Value Date   VD25OH 32.7 11/20/2022   VD25OH 18.0 (L) 07/12/2022    Plan: Continue and refill  prescription ergocalciferol 50,000 IU weekly Low vitamin D levels can be associated with adiposity and may result in leptin resistance and weight gain. Also associated with fatigue. Currently on vitamin D supplementation without any adverse effects.  Recheck vitamin D level in 3-4 months to optimize supplementation and avoid over supplementation.   Low B12 level: B 12 level improving on supplementation. Currently on B 12 daily. No side effects.  Plan: Continue B 12 supplement. COntinue B 12 rich nutrition plan.    ATTESTASTION STATEMENTS:  Reviewed by clinician on day of visit: allergies, medications, problem list, medical history, surgical history, family history, social history, and previous encounter notes.   I have personally spent 42 minutes total time today in preparation, patient care, nutritional counseling and documentation for this visit, including the following: review of clinical lab tests; review of medical tests/procedures/services.      Percy Comp, PA-C

## 2022-12-07 ENCOUNTER — Encounter: Payer: Self-pay | Admitting: Nurse Practitioner

## 2022-12-20 DIAGNOSIS — L84 Corns and callosities: Secondary | ICD-10-CM | POA: Diagnosis not present

## 2022-12-20 DIAGNOSIS — E1142 Type 2 diabetes mellitus with diabetic polyneuropathy: Secondary | ICD-10-CM | POA: Diagnosis not present

## 2022-12-20 DIAGNOSIS — B351 Tinea unguium: Secondary | ICD-10-CM | POA: Diagnosis not present

## 2022-12-20 DIAGNOSIS — M79676 Pain in unspecified toe(s): Secondary | ICD-10-CM | POA: Diagnosis not present

## 2022-12-27 ENCOUNTER — Ambulatory Visit (INDEPENDENT_AMBULATORY_CARE_PROVIDER_SITE_OTHER): Payer: BC Managed Care – PPO | Admitting: Family Medicine

## 2022-12-27 ENCOUNTER — Encounter (INDEPENDENT_AMBULATORY_CARE_PROVIDER_SITE_OTHER): Payer: Self-pay | Admitting: Family Medicine

## 2022-12-27 VITALS — BP 110/78 | HR 82 | Temp 98.0°F | Ht 63.0 in | Wt 230.0 lb

## 2022-12-27 DIAGNOSIS — E669 Obesity, unspecified: Secondary | ICD-10-CM

## 2022-12-27 DIAGNOSIS — Z794 Long term (current) use of insulin: Secondary | ICD-10-CM

## 2022-12-27 DIAGNOSIS — E559 Vitamin D deficiency, unspecified: Secondary | ICD-10-CM | POA: Diagnosis not present

## 2022-12-27 DIAGNOSIS — Z6841 Body Mass Index (BMI) 40.0 and over, adult: Secondary | ICD-10-CM

## 2022-12-27 DIAGNOSIS — E1169 Type 2 diabetes mellitus with other specified complication: Secondary | ICD-10-CM

## 2022-12-27 DIAGNOSIS — Z7985 Long-term (current) use of injectable non-insulin antidiabetic drugs: Secondary | ICD-10-CM

## 2022-12-27 MED ORDER — VITAMIN D (ERGOCALCIFEROL) 1.25 MG (50000 UNIT) PO CAPS
50000.0000 [IU] | ORAL_CAPSULE | ORAL | 0 refills | Status: DC
Start: 2022-12-27 — End: 2023-01-09

## 2022-12-27 NOTE — Progress Notes (Signed)
Loretta Luna, D.O.  ABFM, ABOM Specializing in Clinical Bariatric Medicine  Office located at: [address] [place], [place]  [address]     Assessment and Plan:   Medications Discontinued During This Encounter  Medication Reason   Vitamin D, Ergocalciferol, (DRISDOL) 1.25 MG (50000 UNIT) CAPS capsule Reorder     Meds ordered this encounter  Medications   Vitamin D, Ergocalciferol, (DRISDOL) 1.25 MG (50000 UNIT) CAPS capsule    Sig: Take 1 capsule (50,000 Units total) by mouth every 7 (seven) days.    Dispense:  4 capsule    Refill:  0     Type 2 diabetes mellitus with other specified complication, with long-term current use of insulin ([name]) Assessment:  A1c is stable. Her diabetes mellitus is being treated with Mounjaro 10 mg SQ weekly, Tresiba 30 units daily, and Novolog SSI. Her hunger and cravings are well controlled with the [facility] and when following the prudent nutritional plan.   Lab Results  Component Value Date   HGBA1C 5.5 10/10/2022   HGBA1C 8.6 06/06/2022   HGBA1C 5.5 07/01/2006   INSULIN 17.4 07/12/2022    Plan: Continue with all medications at current dose.   Continue her prudent nutritional plan that is low in simple carbohydrates, saturated fats and trans fats to goal of 5-10% weight loss to achieve significant health benefits.  Pt encouraged to continually advance exercise and cardiovascular fitness as tolerated throughout weight loss journey.  Will continue to monitor condition closely alongside PCP.    Vitamin D deficiency Assessment: Condition is improving, but still not at goal. She reports good compliance and tolerance with Ergocalciferol 50K IU weekly. No concerns.   Lab Results  Component Value Date   VD25OH 32.7 11/20/2022   VD25OH 18.0 (L) 07/12/2022   Plan: Continue with prescribed supplement at current dose. Will refill ERGO today.    Weight loss will likely improve availability of vitamin D, thus encouraged Ethyle to continue  with meal plan and their weight loss efforts to further improve this condition.  Thus, we will need to monitor levels regularly (every 3-4 mo on average) to keep levels within normal limits and prevent over supplementation.   TREATMENT PLAN FOR OBESITY: BMI 40.0-44.9, adult ([name])- curent 40.75 Obesity ([name])- Starting BMI 45.06/DATE 07/12/22 Assessment: Loretta Luna is here to discuss her progress with her obesity treatment plan along with follow-up of her obesity related diagnoses. See Medical Weight Management Flowsheet for complete bioelectrical impedance results.  Condition is improving, but not optimized. Biometric data collected today, was reviewed with patient.   Since last office visit on 12/05/22 patient's  Muscle mass has decreased by 0.4 lb. Fat mass has decreased by 2.2 lb. Total body water has decreased by 0.4 lb.  Counseling done on how various foods will affect these numbers and how to maximize success  Total lbs lost to date: 24  Total weight loss percentage to date: 9.45   Plan: Continue the Category 3 meal plan and begin journaling 1500-1700 calories and 120+ grams of protein  - I showed pt [employer], [employer] ice cream bars, [employer], and [employer], which are healthier options for when she has sweet cravings.   - Reviewed how many calories and grams of protein are on the Category 3 meal plan per pt request. Additionally, I discussed with pt the importance of journaling for increased mindfulness/ intentional eating. I briefly showed her how to accurately document her intake. Reminded pt to aim to  eat foods with a 10:1 ratio of calories to protein.   Behavioral Intervention Additional resources provided today: Food journaling plan information and food journaling log handout  Evidence-based interventions for health behavior change were utilized today including the discussion of self monitoring techniques, problem-solving barriers and SMART goal setting techniques.    Regarding patient's less desirable eating habits and patterns, we employed the technique of small changes.  Pt will specifically work on: journaling her intake/bringing food log and continuing with current exercise regimen for next visit.    Recommended Physical Activity Goals  Maricruz has been advised to slowly work up to 150 minutes of moderate intensity aerobic activity a week and strengthening exercises 2-3 times per week for cardiovascular health, weight loss maintenance and preservation of muscle mass.   She has agreed to Continue current level of physical activity   FOLLOW UP: Return in 2-3 wks. She was informed of the importance of frequent follow up visits to maximize her success with intensive lifestyle modifications for her multiple health conditions.  Subjective:   Chief complaint: Obesity Lafaye is here to discuss her progress with her obesity treatment plan. She is on  the Category 3 Plan and states she is following her eating plan approximately 50% of the time. She states she is walking and doing some resistance training 30 minutes 4 days per week.  Interval History:  Loretta Luna is here for a follow up office visit. Since last OV, Jonte has been doing well. She's working on increasing her lean protein intake and making healthier/more creative recipes. Her hunger and cravings are well controlled when following the prudent nutritional plan.   Pharmacotherapy for weight loss: She is currently taking  Mounjaro  for medical weight loss. Denies side effects.    Review of Systems:  Pertinent positives were addressed with patient today.  Reviewed by clinician on day of visit: allergies, medications, problem list, medical history, surgical history, family history, social history, and previous encounter notes.  Weight Summary and Biometrics   Weight Lost Since Last Visit: 3lb  No data recorded   Vitals Temp: 98 F (36.7 C) BP: 110/78 Pulse Rate: 82 SpO2: 95  %   Anthropometric Measurements Height: 5\' 3"  (1.6 m) Weight: 230 lb (104.3 kg) BMI (Calculated): 40.75 Weight at Last Visit: 233lb Weight Lost Since Last Visit: 3lb Starting Weight: 254lb Total Weight Loss (lbs): 21 lb (9.526 kg)   Body Composition  Body Fat %: 46.4 % Fat Mass (lbs): 107 lbs Muscle Mass (lbs): 117.2 lbs Total Body Water (lbs): 83.2 lbs Visceral Fat Rating : 14   Other Clinical Data Fasting: yes Labs: no Today's Visit #: 9 Starting Date: 07/12/22   Objective:   PHYSICAL EXAM: Blood pressure 110/78, pulse 82, temperature 98 F (36.7 C), height 5\' 3"  (1.6 m), weight 230 lb (104.3 kg), SpO2 95 %. Body mass index is 40.74 kg/m.  General: Well Developed, well nourished, and in no acute distress.  HEENT: Normocephalic, atraumatic Skin: Warm and dry, cap RF less 2 sec, good turgor Chest:  Normal excursion, shape, no gross abn Respiratory: speaking in full sentences, no conversational dyspnea NeuroM-Sk: Ambulates w/o assistance, moves * 4 Psych: A and O *3, insight good, mood-full  DIAGNOSTIC DATA REVIEWED:  BMET    Component Value Date/Time   NA 142 11/20/2022 0833   K 4.4 11/20/2022 0833   CL 102 11/20/2022 0833   CO2 23 11/20/2022 0833   GLUCOSE 118 (H) 11/20/2022 0833   GLUCOSE 120 (  H) 07/15/2012 1058   BUN 16 11/20/2022 0833   CREATININE 0.65 11/20/2022 0833   CREATININE 0.77 07/15/2012 1058   CALCIUM 10.5 (H) 11/20/2022 0833   Lab Results  Component Value Date   HGBA1C 5.5 10/10/2022   HGBA1C 5.5 07/01/2006   Lab Results  Component Value Date   INSULIN 17.4 07/12/2022   Lab Results  Component Value Date   TSH 3.110 07/12/2022   CBC    Component Value Date/Time   WBC 7.2 06/06/2022 0000   WBC 8.2 07/15/2012 1058   RBC 5.81 (A) 06/06/2022 0000   HGB 15.8 06/06/2022 0000   HCT 49 (A) 06/06/2022 0000   PLT 317 06/06/2022 0000   MCV 78.0 07/15/2012 1058   MCH 26.3 07/15/2012 1058   MCHC 33.7 07/15/2012 1058   RDW 15.5  07/15/2012 1058   Iron Studies No results found for: "IRON", "TIBC", "FERRITIN", "IRONPCTSAT" Lipid Panel     Component Value Date/Time   CHOL 134 07/12/2022 1055   TRIG 381 (H) 07/12/2022 1055   HDL 43 07/12/2022 1055   CHOLHDL 4.7 Ratio 03/28/2007 2157   VLDL NOT CALC mg/dL 62/37/6283 1517   LDLCALC 36 07/12/2022 1055   Hepatic Function Panel     Component Value Date/Time   PROT 6.9 11/20/2022 0833   ALBUMIN 4.5 11/20/2022 0833   AST 23 11/20/2022 0833   ALT 26 11/20/2022 0833   ALKPHOS 93 11/20/2022 0833   BILITOT 0.3 11/20/2022 0833      Component Value Date/Time   TSH 3.110 07/12/2022 1055   Nutritional Lab Results  Component Value Date   VD25OH 32.7 11/20/2022   VD25OH 18.0 (L) 07/12/2022    Attestations:   Patient was in the office today and time spent on visit including pre-visit chart review and post-visit care/coordination of care and electronic medical record documentation was 40 minutes. 50% of the time was in face to face counseling of this patient's medical condition(s) and providing education on treatment options to include the first-line treatment of diet and lifestyle modification.   I, Special Randolm Idol, acting as a Stage manager for [employer], DO., have compiled all relevant documentation for today's office visit on behalf of Thomasene Lot, DO, while in the presence of [employer], DO.  I have reviewed the above documentation for accuracy and completeness, and I agree with the above. Loretta Luna, D.O.  The 21st Century Cures Act was signed into law in 2016 which includes the topic of electronic health records.  This provides immediate access to information in MyChart.  This includes consultation notes, operative notes, office notes, lab results and pathology reports.  If you have any questions about what you read please let us know at your next visit so we can discuss your concerns and take corrective action if need be.  We are right here  with you.

## 2023-01-09 ENCOUNTER — Encounter (INDEPENDENT_AMBULATORY_CARE_PROVIDER_SITE_OTHER): Payer: Self-pay | Admitting: Physician Assistant

## 2023-01-09 ENCOUNTER — Ambulatory Visit (INDEPENDENT_AMBULATORY_CARE_PROVIDER_SITE_OTHER): Payer: BC Managed Care – PPO | Admitting: Physician Assistant

## 2023-01-09 VITALS — BP 108/73 | HR 78 | Temp 97.7°F | Ht 63.0 in | Wt 231.0 lb

## 2023-01-09 DIAGNOSIS — E669 Obesity, unspecified: Secondary | ICD-10-CM

## 2023-01-09 DIAGNOSIS — E559 Vitamin D deficiency, unspecified: Secondary | ICD-10-CM | POA: Diagnosis not present

## 2023-01-09 DIAGNOSIS — Z794 Long term (current) use of insulin: Secondary | ICD-10-CM

## 2023-01-09 DIAGNOSIS — Z7985 Long-term (current) use of injectable non-insulin antidiabetic drugs: Secondary | ICD-10-CM

## 2023-01-09 DIAGNOSIS — E538 Deficiency of other specified B group vitamins: Secondary | ICD-10-CM | POA: Diagnosis not present

## 2023-01-09 DIAGNOSIS — E1169 Type 2 diabetes mellitus with other specified complication: Secondary | ICD-10-CM | POA: Diagnosis not present

## 2023-01-09 DIAGNOSIS — Z6841 Body Mass Index (BMI) 40.0 and over, adult: Secondary | ICD-10-CM

## 2023-01-09 MED ORDER — VITAMIN D (ERGOCALCIFEROL) 1.25 MG (50000 UNIT) PO CAPS
50000.0000 [IU] | ORAL_CAPSULE | ORAL | 0 refills | Status: DC
Start: 2023-01-09 — End: 2023-02-21

## 2023-01-09 NOTE — Progress Notes (Signed)
.smr  Office: [phone]  /  Fax: [phone]  WEIGHT SUMMARY AND BIOMETRICS  Vitals Temp: 97.7 F (36.5 C) BP: 108/73 Pulse Rate: 78 SpO2: 98 %   Anthropometric Measurements Height: 5\' 3"  (1.6 m) Weight: 231 lb (104.8 kg) BMI (Calculated): 40.93 Weight at Last Visit: 230 lb Weight Lost Since Last Visit: 0 Weight Gained Since Last Visit: 1 lb Starting Weight: 254 lb Total Weight Loss (lbs): 23 lb (10.4 kg) Peak Weight: 289 lb   Body Composition  Body Fat %: 46.8 % Fat Mass (lbs): 108.4 lbs Muscle Mass (lbs): 117.2 lbs Total Body Water (lbs): 84.2 lbs Visceral Fat Rating : 14   Other Clinical Data Fasting: yes Labs: no Today's Visit #: 10 Starting Date: 07/12/22     HPI  Chief Complaint: OBESITY  Loretta Luna is here to discuss her progress with her obesity treatment plan. She is on the the Category 3 Plan and states she is following her eating plan approximately 40 % of the time. She states she is exercising 30 minutes 5 times per week.   Interval History:  Since last office visit she up 1 lb.   Struggled with in hunger/ over eating more over the past month. Most hungry in mornings after eating breakfast . Having protein snack most of the time between breakfast and lunch, but sometimes indulges in [facility]- 260 calories/7 grams saturated fat. We discussed the better protein snack options. She does like the pita crackers and Tzatziki dip made with [place] yogurt.  Hunger/appetite-increased hunger Cravings- denies excessive cravings.  Stress- Work stress improved as has new coworker Sleep- better overall Exercise-trying to walk 30 mins 5 times weekly and used ankle weights 1 time but needs to decrease the amount of weight as felt they were too heavy.  Protein intake- adequate.     Pharmacotherapy: Mounjaro 10 mg weekly Per Endocrinology - Ronny Bacon, FNP.   Denies mass in neck, dysphagia, dyspepsia, persistent hoarseness, abdominal pain, or  N/V/Constipation or diarrhea. Has annual eye exam. Mood is stable.    TREATMENT PLAN FOR OBESITY: Down 23 lbs.  TBW loss of 9.1 % Recommended Dietary Goals  Loretta Luna is currently in the action stage of change. As such, her goal is to continue weight management plan. She has agreed to the Category 3 Plan.  Behavioral Intervention  We discussed the following Behavioral Modification Strategies today: increasing lean protein intake, decreasing simple carbohydrates , increasing vegetables, increasing lower glycemic fruits, increasing water intake, continue to practice mindfulness when eating, planning for success, and better snacking choices.  Additional resources provided today: Smart fruit list for lower glycemic fruit choices  Recommended Physical Activity Goals  Loretta Luna has been advised to work up to 150 minutes of moderate intensity aerobic activity a week and strengthening exercises 2-3 times per week for cardiovascular health, weight loss maintenance and preservation of muscle mass.   She has agreed to Continue current level of physical activity  and Increase physical activity in their day and reduce sedentary time (increase NEAT).   Pharmacotherapy We discussed various medication options to help Loretta Luna with her weight loss efforts and we both agreed to continue Loretta Luna  for Type 2 diabetes.     Return in about 3 weeks (around 01/30/2023).Marland Kitchen She was informed of the importance of frequent follow up visits to maximize her success with intensive lifestyle modifications for her multiple health conditions.  PHYSICAL EXAM:  Blood pressure 108/73, pulse 78, temperature 97.7 F (36.5 C), height 5\' 3"  (1.6  m), weight 231 lb (104.8 kg), SpO2 98 %. Body mass index is 40.92 kg/m.  General: She is overweight, cooperative, alert, well developed, and in no acute distress. PSYCH: Has normal mood, affect and thought process.   Cardiovascular: HR 70's regular, BP 108/73 Lungs: Normal breathing  effort, no conversational dyspnea.  DIAGNOSTIC DATA REVIEWED:  BMET    Component Value Date/Time   NA 142 11/20/2022 0833   K 4.4 11/20/2022 0833   CL 102 11/20/2022 0833   CO2 23 11/20/2022 0833   GLUCOSE 118 (H) 11/20/2022 0833   GLUCOSE 120 (H) 07/15/2012 1058   BUN 16 11/20/2022 0833   CREATININE 0.65 11/20/2022 0833   CREATININE 0.77 07/15/2012 1058   CALCIUM 10.5 (H) 11/20/2022 0833   Lab Results  Component Value Date   HGBA1C 5.5 10/10/2022   HGBA1C 5.5 07/01/2006   Lab Results  Component Value Date   INSULIN 17.4 07/12/2022   Lab Results  Component Value Date   TSH 3.110 07/12/2022   CBC    Component Value Date/Time   WBC 7.2 06/06/2022 0000   WBC 8.2 07/15/2012 1058   RBC 5.81 (A) 06/06/2022 0000   HGB 15.8 06/06/2022 0000   HCT 49 (A) 06/06/2022 0000   PLT 317 06/06/2022 0000   MCV 78.0 07/15/2012 1058   MCH 26.3 07/15/2012 1058   MCHC 33.7 07/15/2012 1058   RDW 15.5 07/15/2012 1058   Iron Studies No results found for: "IRON", "TIBC", "FERRITIN", "IRONPCTSAT" Lipid Panel     Component Value Date/Time   CHOL 134 07/12/2022 1055   TRIG 381 (H) 07/12/2022 1055   HDL 43 07/12/2022 1055   CHOLHDL 4.7 Ratio 03/28/2007 2157   VLDL NOT CALC mg/dL 40/98/1191 4782   LDLCALC 36 07/12/2022 1055   Hepatic Function Panel     Component Value Date/Time   PROT 6.9 11/20/2022 0833   ALBUMIN 4.5 11/20/2022 0833   AST 23 11/20/2022 0833   ALT 26 11/20/2022 0833   ALKPHOS 93 11/20/2022 0833   BILITOT 0.3 11/20/2022 0833      Component Value Date/Time   TSH 3.110 07/12/2022 1055   Nutritional Lab Results  Component Value Date   VD25OH 32.7 11/20/2022   VD25OH 18.0 (L) 07/12/2022    ASSOCIATED CONDITIONS ADDRESSED TODAY  ASSESSMENT AND PLAN  Problem List Items Addressed This Visit     Vitamin D deficiency - Primary   Relevant Medications   Vitamin D, Ergocalciferol, (DRISDOL) 1.25 MG (50000 UNIT) CAPS capsule   B12 deficiency   BMI 40.0-44.9,  adult ([name]), Current BMI 43.59   Obesity ([name])- Starting BMI 45.06/DATE 07/12/22   Diabetes mellitus ([name])   Type 2 Diabetes Mellitus with other specified complication, with long-term current use of insulin HgbA1c is at goal. Last A1c was 5.5. CGM reported A1C of 6.7 for last 90 days.  CBGs: Fasting 120-130's  Episodes of hypoglycemia: no Medication(s): Mounjaro 10 mg SQ weekly Denies mass in neck, dysphagia, dyspepsia, persistent hoarseness, abdominal pain, or N/V/Constipation or diarrhea. Has annual eye exam. Mood is stable.  Also Tresiba insulin 30 units in evening only.   Lab Results  Component Value Date   HGBA1C 5.5 10/10/2022   HGBA1C 8.6 06/06/2022   HGBA1C 5.5 07/01/2006   Lab Results  Component Value Date   LDLCALC 36 07/12/2022   CREATININE 0.65 11/20/2022   No results found for: "GFR"  Plan: Continue Mounjaro 10 mg SQ weekly and discuss possible increase to 12.5 mg weekly per Endocrinology  NP. Continue usual Loretta Luna per Endocrinology.  Continue working on nutrition plan to decrease simple carbohydrates, increase lean proteins and exercise to promote weight loss and improve glycemic control .   Vitamin D Deficiency Vitamin D is not at goal of 50.  Most recent vitamin D level was 32.7- improving, but not a goal of 50-70. She is on  prescription ergocalciferol 50,000 IU weekly. Lab Results  Component Value Date   VD25OH 32.7 11/20/2022   VD25OH 18.0 (L) 07/12/2022    Plan: Continue and refill  prescription ergocalciferol 50,000 IU weekly Low vitamin D levels can be associated with adiposity and may result in leptin resistance and weight gain. Also associated with fatigue. Currently on vitamin D supplementation without any adverse effects.  Plan to recheck vitamin D level over the next 2-3 months.    B12 deficiency:  B 12 level improving increased to 536 from 296.  On B 12 500 mcg daily. No side effects. Endorses continued fatigue. Very tired by night time.  Plan  Continue B 12 500 mcg daily. Recheck B 12 level over next 2-3 months.   ATTESTASTION STATEMENTS:  Reviewed by clinician on day of visit: allergies, medications, problem list, medical history, surgical history, family history, social history, and previous encounter notes.   I have personally spent 49 minutes total time today in preparation, patient care, nutritional counseling and documentation for this visit, including the following: review of clinical lab tests; review of medical tests/procedures/services.      Loretta Brigandi, PA-C

## 2023-01-16 ENCOUNTER — Encounter: Payer: Self-pay | Admitting: Nurse Practitioner

## 2023-01-16 ENCOUNTER — Ambulatory Visit (INDEPENDENT_AMBULATORY_CARE_PROVIDER_SITE_OTHER): Payer: BC Managed Care – PPO | Admitting: Nurse Practitioner

## 2023-01-16 VITALS — BP 111/74 | HR 82 | Ht 63.0 in | Wt 234.0 lb

## 2023-01-16 DIAGNOSIS — Z7985 Long-term (current) use of injectable non-insulin antidiabetic drugs: Secondary | ICD-10-CM | POA: Diagnosis not present

## 2023-01-16 DIAGNOSIS — E119 Type 2 diabetes mellitus without complications: Secondary | ICD-10-CM

## 2023-01-16 DIAGNOSIS — Z794 Long term (current) use of insulin: Secondary | ICD-10-CM

## 2023-01-16 LAB — POCT GLYCOSYLATED HEMOGLOBIN (HGB A1C): Hemoglobin A1C: 6 % — AB (ref 4.0–5.6)

## 2023-01-16 MED ORDER — TIRZEPATIDE 12.5 MG/0.5ML ~~LOC~~ SOAJ: 12.5000 mg | SUBCUTANEOUS | 1 refills | Status: DC

## 2023-01-16 NOTE — Progress Notes (Signed)
Endocrinology Follow Up Note       01/16/2023, 9:08 AM   Subjective:    Patient ID: Loretta Luna, female    DOB: [birth year].  Loretta Luna is being seen in consultation for management of currently uncontrolled symptomatic diabetes requested by  Benita Stabile, MD.  She also sees healthy weight and wellness in [place].   Past Medical History:  Diagnosis Date   Anxiety    Asthma    Back pain    Chest pain    Constipation    Depression    Diabetes ([name])    Essential hypertension    Fatty liver    GERD (gastroesophageal reflux disease)    History of swelling of feet    Hypercalcemia    Hyperglycemia    Hypokalemia    IBS (irritable bowel syndrome)    Infertility, female    Insomnia    Joint pain    Mixed hyperlipidemia    Obesity    Pancreatic cyst    Polycystic ovarian syndrome    Proteinuria    Systolic murmur    Type 2 diabetes mellitus ([name])     Past Surgical History:  Procedure Laterality Date   CHOLECYSTECTOMY      Social History   Socioeconomic History   Marital status: Single    Spouse name: Not on file   Number of children: Not on file   Years of education: Not on file   Highest education level: Not on file  Occupational History   Not on file  Tobacco Use   Smoking status: Never   Smokeless tobacco: Never  Substance and Sexual Activity   Alcohol use: No    Alcohol/week: 0.0 standard drinks of alcohol   Drug use: Not on file   Sexual activity: Not on file  Other Topics Concern   Not on file  Social History Narrative   Not on file   Social Determinants of Health   Financial Resource Strain: Not on file  Food Insecurity: Not on file  Transportation Needs: Not on file  Physical Activity: Insufficiently Active (05/05/2020)   Received from [facility]   Exercise Vital Sign    Days of Exercise per Week: 3 days    Minutes of Exercise per Session: 30 min   Stress: Not on file  Social Connections: Not on file    Family History  Problem Relation Age of Onset   COPD Mother    Hyperlipidemia Mother    Hypertension Mother    Stroke Mother    Diabetes Mother    Heart disease Mother    Obesity Mother    Hyperlipidemia Father    Hypertension Father    Stroke Father    Diabetes Father    Sleep apnea Father    Obesity Father    Hypertension Sister    Asthma Brother    Hyperlipidemia Brother    Hypertension Brother    Cancer Maternal Grandmother    COPD Maternal Grandfather    Hyperlipidemia Maternal Grandfather    Cancer Paternal Grandmother    Stroke Paternal Grandfather     Outpatient Encounter Medications as of 01/16/2023  Medication Sig   Bempedoic Acid-Ezetimibe (NEXLIZET) 180-10 MG TABS Take by mouth.   Continuous Blood Gluc Sensor (DEXCOM G7 SENSOR) MISC Change sensor every 10 days as directed.   cyanocobalamin (VITAMIN B12) 500 MCG tablet Take 1 tablet (500 mcg total) by mouth daily. (Patient taking differently: Take 1,000 mcg by mouth daily.)   furosemide (LASIX) 20 MG tablet Take 20 mg by mouth daily.   icosapent Ethyl (VASCEPA) 1 g capsule Take 2 g by mouth 2 (two) times daily.   loratadine (CLARITIN) 10 MG tablet Take 10 mg by mouth daily.   Magnesium 250 MG TABS May use CALM supplement- 325mg  mag 1-twice daily   NOVOLOG FLEXPEN 100 UNIT/ML FlexPen Inject 5-11 Units into the skin 3 (three) times daily with meals.   olmesartan (BENICAR) 40 MG tablet Take 40 mg by mouth daily.   potassium chloride (KLOR-CON) 10 MEQ tablet Take 10 mEq by mouth 2 (two) times daily.   rosuvastatin (CRESTOR) 20 MG tablet Take 20 mg by mouth daily.   tirzepatide (MOUNJARO) 12.5 MG/0.5ML Pen Inject 12.5 mg into the skin once a week.   TRESIBA FLEXTOUCH 200 UNIT/ML FlexTouch Pen Inject 30 Units into the skin at bedtime.   Vitamin D, Ergocalciferol, (DRISDOL) 1.25 MG (50000 UNIT) CAPS capsule Take 1 capsule (50,000 Units total) by mouth every 7  (seven) days.   [DISCONTINUED] tirzepatide (MOUNJARO) 10 MG/0.5ML Pen Inject 10 mg into the skin once a week.   [DISCONTINUED] Vitamin D, Ergocalciferol, (DRISDOL) 1.25 MG (50000 UNIT) CAPS capsule Take 1 capsule (50,000 Units total) by mouth every 7 (seven) days.   No facility-administered encounter medications on file as of 01/16/2023.    ALLERGIES: Allergies  Allergen Reactions   Food     shellfish   Iodine     REACTION: swelling, hives, tachypnea   Metformin And Related Diarrhea    VACCINATION STATUS: Immunization History  Administered Date(s) Administered   Influenza Whole 04/10/2007    Diabetes She presents for her follow-up diabetic visit. She has type 2 diabetes mellitus. Onset time: diagnosed at approx age of 52. Her disease course has been improving. There are no hypoglycemic associated symptoms. There are no diabetic associated symptoms. There are no diabetic complications. Risk factors for coronary artery disease include diabetes mellitus, dyslipidemia, family history, obesity, hypertension and sedentary lifestyle. Current diabetic treatment includes intensive insulin program (and Mounjaro). She is compliant with treatment most of the time (has had trouble getting Mounjaro intermittently due to supply issues). Her weight is fluctuating minimally. She is following a generally healthy diet. Meal planning includes ADA exchanges, avoidance of concentrated sweets, carbohydrate counting and calorie counting. She has had a previous visit with a dietitian. She rarely participates in exercise. Her home blood glucose trend is decreasing steadily. Her overall blood glucose range is 110-130 mg/dl. (She presents today with her CGM, no logs, showing at target glycemic profile.  Her POCT A1c today is 6%, increasing from last visit of 5.5%.  She denies any significant hypoglycemia.  She did have infection when she went to [place] and glucose went up briefly, but after treatment with ABX, glucose came  back down.  Analysis of her CGM shows TIR 100%, TAR 0%, TBR 0%.) An ACE inhibitor/angiotensin II receptor blocker is being taken. She sees a podiatrist.Eye exam is current.   Review of systems  Constitutional: + Minimally fluctuating body weight,  current Body mass index is 41.45 kg/m. , no fatigue, no subjective hyperthermia, no subjective hypothermia Eyes: no blurry  vision, no xerophthalmia ENT: no sore throat, no nodules palpated in throat, no dysphagia/odynophagia, no hoarseness Cardiovascular: no chest pain, no shortness of breath, no palpitations, no leg swelling Respiratory: no cough, no shortness of breath Gastrointestinal: no nausea/vomiting/diarrhea Musculoskeletal: no muscle/joint aches Skin: no rashes, no hyperemia Neurological: no tremors, no numbness, no tingling, no dizziness Psychiatric: no depression, no anxiety  Objective:     BP 111/74 (BP Location: Right Arm, Patient Position: Sitting, Cuff Size: Large)   Pulse 82   Ht 5\' 3"  (1.6 m)   Wt 234 lb (106.1 kg)   BMI 41.45 kg/m   Wt Readings from Last 3 Encounters:  01/16/23 234 lb (106.1 kg)  01/09/23 231 lb (104.8 kg)  12/27/22 230 lb (104.3 kg)     BP Readings from Last 3 Encounters:  01/16/23 111/74  01/09/23 108/73  12/27/22 110/78      Physical Exam- Limited  Constitutional:  Body mass index is 41.45 kg/m. , not in acute distress, normal state of mind Eyes:  EOMI, no exophthalmos Musculoskeletal: no gross deformities, strength intact in all four extremities, no gross restriction of joint movements Skin:  no rashes, no hyperemia Neurological: no tremor with outstretched hands   Diabetic Foot Exam - Simple   Simple Foot Form Diabetic Foot exam was performed with the following findings: Yes 01/16/2023  9:00 AM  Visual Inspection See comments: Yes Sensation Testing Intact to touch and monofilament testing bilaterally: Yes Pulse Check Posterior Tibialis and Dorsalis pulse intact bilaterally:  Yes Comments Calluses bilaterally- sees podiatry routinely    CMP ( most recent) CMP     Component Value Date/Time   NA 142 11/20/2022 0833   K 4.4 11/20/2022 0833   CL 102 11/20/2022 0833   CO2 23 11/20/2022 0833   GLUCOSE 118 (H) 11/20/2022 0833   GLUCOSE 120 (H) 07/15/2012 1058   BUN 16 11/20/2022 0833   CREATININE 0.65 11/20/2022 0833   CREATININE 0.77 07/15/2012 1058   CALCIUM 10.5 (H) 11/20/2022 0833   PROT 6.9 11/20/2022 0833   ALBUMIN 4.5 11/20/2022 0833   AST 23 11/20/2022 0833   ALT 26 11/20/2022 0833   ALKPHOS 93 11/20/2022 0833   BILITOT 0.3 11/20/2022 0833     Diabetic Labs (most recent): Lab Results  Component Value Date   HGBA1C 6.0 (A) 01/16/2023   HGBA1C 5.5 10/10/2022   HGBA1C 8.6 06/06/2022     Lipid Panel ( most recent) Lipid Panel     Component Value Date/Time   CHOL 134 07/12/2022 1055   TRIG 381 (H) 07/12/2022 1055   HDL 43 07/12/2022 1055   CHOLHDL 4.7 Ratio 03/28/2007 2157   VLDL NOT CALC mg/dL 78/46/9629 5284   LDLCALC 36 07/12/2022 1055   LABVLDL 55 (H) 07/12/2022 1055      Lab Results  Component Value Date   TSH 3.110 07/12/2022   FREET4 1.10 07/12/2022           Assessment & Plan:   1) Type 2 diabetes mellitus without complication, with long-term current use of insulin  She presents today with her CGM, no logs, showing at target glycemic profile.  Her POCT A1c today is 6%, increasing from last visit of 5.5%.  She denies any significant hypoglycemia.  She did have infection when she went to [place] and glucose went up briefly, but after treatment with ABX, glucose came back down.  Analysis of her CGM shows TIR 100%, TAR 0%, TBR 0%.  - KAHLIYA FRALEIGH has currently  uncontrolled symptomatic type 2 DM since 52 years of age.   -Recent labs reviewed.  - I had a long discussion with her about the progressive nature of diabetes and the pathology behind its complications. -her diabetes is not currently complicated but she remains at  a high risk for more acute and chronic complications which include CAD, CVA, CKD, retinopathy, and neuropathy. These are all discussed in detail with her.  The following Lifestyle Medicine recommendations according to American College of Lifestyle Medicine [facility]) were discussed and offered to patient and she agrees to start the journey:  A. Whole Foods, Plant-based plate comprising of fruits and vegetables, plant-based proteins, whole-grain carbohydrates was discussed in detail with the patient.   A list for source of those nutrients were also provided to the patient.  Patient will use only water or unsweetened tea for hydration. B.  The need to stay away from risky substances including alcohol, smoking; obtaining 7 to 9 hours of restorative sleep, at least 150 minutes of moderate intensity exercise weekly, the importance of healthy social connections,  and stress reduction techniques were discussed. C.  A full color page of  Calorie density of various food groups per pound showing examples of each food groups was provided to the patient.  - Nutritional counseling repeated at each appointment due to patients tendency to fall back in to old habits.  - The patient admits there is a room for improvement in their diet and drink choices. -  Suggestion is made for the patient to avoid simple carbohydrates from their diet including Cakes, Sweet Desserts / Pastries, Ice Cream, Soda (diet and regular), Sweet Tea, Candies, Chips, Cookies, Sweet Pastries, Store Bought Juices, Alcohol in Excess of 1-2 drinks a day, Artificial Sweeteners, Coffee Creamer, and "Sugar-free" Products. This will help patient to have stable blood glucose profile and potentially avoid unintended weight gain.   - I encouraged the patient to switch to unprocessed or minimally processed complex starch and increased protein intake (animal or plant source), fruits, and vegetables.   - Patient is advised to stick to a routine mealtimes to eat 3  meals a day and avoid unnecessary snacks (to snack only to correct hypoglycemia).  - I have approached her with the following individualized plan to manage her diabetes and patient agrees:   - She is advised to continue her Tresiba 30 units SQ nightly and Novolog 5-11 units SQ TID with meals if glucose is above 90 and she is eating (Specific instructions on how to titrate insulin dosage based on glucose readings given to patient in writing).  Will increase her Mounjaro to 12.5 mg SQ weekly.  Once she increases her Mounjaro, I advised her to stop the Novolog to see how she does without it.  -she is encouraged to continue monitoring glucose 4 times daily (using her CGM), before meals and before bed, and to call the clinic if she has readings less than 70 or above 200 for 3 tests in a row.  - she is warned not to take insulin without proper monitoring per orders. - Adjustment parameters are given to her for hypo and hyperglycemia in writing.  -She has been on several other medications for diabetes and did not tolerate them including: Invokana (yeast infections), Afrezza (chronic cough), Victoza (ineffective), Metformin (GI issues).  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  I do not feel this is impacting her diabetes from the available chart  notes.  - Specific targets for  A1c; LDL, HDL, and Triglycerides were discussed with the patient.  2) Blood Pressure /Hypertension:  her blood pressure is controlled to target.   she is advised to continue her current medications including Benicar 40 mg p.o. daily with breakfast and Lasix 20 mg po daily.  3) Lipids/Hyperlipidemia:    Review of her recent lipid panel from 08/08/22 showed controlled LDL at 35 and elevated triglycerides of 203 (improving) .  she is advised to continue Nexlizet 180-10 mg mg daily at bedtime and Crestor 20 mg po daily.  Side effects and precautions discussed with  her.  4)  Weight/Diet:  her Body mass index is 41.45 kg/m.  -  clearly complicating her diabetes care.   she is a candidate for weight loss. I discussed with her the fact that loss of 5 - 10% of her  current body weight will have the most impact on her diabetes management.  Exercise, and detailed carbohydrates information provided  -  detailed on discharge instructions.  5) Chronic Care/Health Maintenance: -she is on ACEI/ARB and Statin medications and is encouraged to initiate and continue to follow up with Ophthalmology, Dentist, Podiatrist at least yearly or according to recommendations, and advised to stay away from smoking. I have recommended yearly flu vaccine and pneumonia vaccine at least every 5 years; moderate intensity exercise for up to 150 minutes weekly; and sleep for at least 7 hours a day.  - she is advised to maintain close follow up with Benita Stabile, MD for primary care needs, as well as her other providers for optimal and coordinated care.     I spent  44  minutes in the care of the patient today including review of labs from CMP, Lipids, Thyroid Function, Hematology (current and previous including abstractions from other facilities); face-to-face time discussing  her blood glucose readings/logs, discussing hypoglycemia and hyperglycemia episodes and symptoms, medications doses, her options of short and long term treatment based on the latest standards of care / guidelines;  discussion about incorporating lifestyle medicine;  and documenting the encounter. Risk reduction counseling performed per USPSTF guidelines to reduce obesity and cardiovascular risk factors.     Please refer to Patient Instructions for Blood Glucose Monitoring and Insulin/Medications Dosing Guide"  in media tab for additional information. Please  also refer to " Patient Self Inventory" in the Media  tab for reviewed elements of pertinent patient history.  Carmelina Dane participated in the discussions,  expressed understanding, and voiced agreement with the above plans.  All questions were answered to her satisfaction. she is encouraged to contact clinic should she have any questions or concerns prior to her return visit.     Follow up plan: - Return in about 4 months (around 05/19/2023) for Diabetes F/U with A1c in office, No previsit labs, Bring meter and logs.   Ronny Bacon, [facility] [address] [place], [place] [address] Phone: [phone] Fax: [phone]  01/16/2023, 9:08 AM

## 2023-01-31 ENCOUNTER — Ambulatory Visit (INDEPENDENT_AMBULATORY_CARE_PROVIDER_SITE_OTHER): Payer: BC Managed Care – PPO | Admitting: Family Medicine

## 2023-02-12 DIAGNOSIS — E1169 Type 2 diabetes mellitus with other specified complication: Secondary | ICD-10-CM | POA: Diagnosis not present

## 2023-02-12 DIAGNOSIS — E7849 Other hyperlipidemia: Secondary | ICD-10-CM | POA: Diagnosis not present

## 2023-02-12 DIAGNOSIS — E119 Type 2 diabetes mellitus without complications: Secondary | ICD-10-CM | POA: Diagnosis not present

## 2023-02-12 DIAGNOSIS — Z23 Encounter for immunization: Secondary | ICD-10-CM | POA: Diagnosis not present

## 2023-02-12 DIAGNOSIS — E876 Hypokalemia: Secondary | ICD-10-CM | POA: Diagnosis not present

## 2023-02-12 DIAGNOSIS — I10 Essential (primary) hypertension: Secondary | ICD-10-CM | POA: Diagnosis not present

## 2023-02-20 NOTE — Progress Notes (Signed)
.smr  Office: [phone]  /  Fax: [phone]  WEIGHT SUMMARY AND BIOMETRICS  Vitals Temp: 97.7 F (36.5 C) BP: 97/65 Pulse Rate: 77 SpO2: 98 %     Anthropometric Measurements Height: 5\' 3"  (1.6 m) Weight: 225 lb (102.1 kg) BMI (Calculated): 39.87 Weight at Last Visit: 234 lb Weight Lost Since Last Visit: 6 Weight Gained Since Last Visit: 0 lb Starting Weight: 254 lb Total Weight Loss (lbs): 29 lb (13.2 kg) Peak Weight: 289 lb     Body Composition  Body Fat %: 45.3 % Fat Mass (lbs): 102.2 lbs Muscle Mass (lbs): 117.4 lbs Total Body Water (lbs): 80.4 lbs Visceral Fat Rating : 14     Other Clinical Data Fasting: yes Labs: no Today's Visit #: 11 Starting Date: 07/12/22  HPI  Chief Complaint: OBESITY  Loretta Luna is here to discuss her progress with her obesity treatment plan. She is on the the Category 3 Plan and states she is following her eating plan approximately 40 % of the time. She states she is exercising 30 minutes 4 times per week. Discussed the use of AI scribe software for clinical note transcription with the patient, who gave verbal consent to proceed.  History of Present Illness     The patient, with a known history of diabetes, reports a recent increase in blood sugar levels. She attributes this to dietary changes, specifically the consumption of crab dip. However, she also notes that her blood sugars have been gradually increasing over the past few weeks. The patient also reports feeling exhausted and experiencing poor sleep quality. She describes difficulty falling asleep and staying asleep, often ruminating over thoughts at night. She also reports episodes of falling asleep during the day, particularly after meals.  In addition to these concerns, the patient reports changes in urinary symptoms. She has noticed blood in her urine and has a history of kidney stones. She also reports a recent episode of suspected kidney infection, which was treated with  Cipro. The patient also mentions constipation and is seeking advice on dietary supplements and lifestyle modifications.  The patient also discusses work-related stress and the impact it has on her health. She reports working long hours and struggling to maintain a work-life balance. She expresses a desire for a vacation and is planning a trip to the beach in the near future.    Interval History:  Since last office visit she is down 6 lbs  Hunger/appetite-moderate control Cravings- not excessive Stress- maybe slightly better overall Sleep- not restorative. Has sleep consult pending Exercise-walking 30 mins 4 times weekly Hydration-needs to increase fluid intake.    Pharmacotherapy: Mounjaro 12.5 mg weekly. Denies mass in neck, dysphagia, dyspepsia, persistent hoarseness, abdominal pain, or N/V/or diarrhea. Has annual eye exam. Mood is stable. Constipated and having little results with laxative. Discussed use of miralax daily and importance of adequate fluid intake for bowel and urinary health.    TREATMENT PLAN FOR OBESITY:  Recommended Dietary Goals  Loretta Luna is currently in the action stage of change. As such, her goal is to continue weight management plan. She has agreed to the Category 3 Plan.  Behavioral Intervention  We discussed the following Behavioral Modification Strategies today: increasing lean protein intake, decreasing simple carbohydrates , increasing vegetables, increasing lower glycemic fruits, increasing fiber rich foods, avoiding skipping meals, increasing water intake, emotional eating strategies and understanding the difference between hunger signals and cravings, work on managing stress, creating time for self-care and relaxation measures, continue to practice mindfulness when  eating, and planning for success.  Additional resources provided today: NA  Recommended Physical Activity Goals  Loretta Luna has been advised to work up to 150 minutes of moderate intensity aerobic  activity a week and strengthening exercises 2-3 times per week for cardiovascular health, weight loss maintenance and preservation of muscle mass.   She has agreed to Continue current level of physical activity  and Think about ways to increase daily physical activity and overcoming barriers to exercise   Pharmacotherapy We discussed various medication options to help Loretta Luna with her weight loss efforts and we both agreed to continue Mounjaro 12.5 mg weekly.    Return in about 4 weeks (around 03/21/2023).Marland Kitchen She was informed of the importance of frequent follow up visits to maximize her success with intensive lifestyle modifications for her multiple health conditions.  PHYSICAL EXAM:  Blood pressure 97/65, pulse 77, temperature 97.7 F (36.5 C), height 5\' 3"  (1.6 m), weight 225 lb (102.1 kg), SpO2 98%. Body mass index is 39.86 kg/m.  General: She is overweight, cooperative, alert, well developed, and in no acute distress. PSYCH: Has normal mood, affect and thought process.   Cardiovascular: HR 70's regular, BP 97/65- monitor- may need to decrease antihypertensives.  Lungs: Normal breathing effort, no conversational dyspnea.  DIAGNOSTIC DATA REVIEWED:  BMET    Component Value Date/Time   NA 142 11/20/2022 0833   K 4.4 11/20/2022 0833   CL 102 11/20/2022 0833   CO2 23 11/20/2022 0833   GLUCOSE 118 (H) 11/20/2022 0833   GLUCOSE 120 (H) 07/15/2012 1058   BUN 16 11/20/2022 0833   CREATININE 0.65 11/20/2022 0833   CREATININE 0.77 07/15/2012 1058   CALCIUM 10.5 (H) 11/20/2022 0833   Lab Results  Component Value Date   HGBA1C 6.0 (A) 01/16/2023   HGBA1C 5.5 07/01/2006   Lab Results  Component Value Date   INSULIN 17.4 07/12/2022   Lab Results  Component Value Date   TSH 3.110 07/12/2022   CBC    Component Value Date/Time   WBC 7.2 06/06/2022 0000   WBC 8.2 07/15/2012 1058   RBC 5.81 (A) 06/06/2022 0000   HGB 15.8 06/06/2022 0000   HCT 49 (A) 06/06/2022 0000   PLT 317  06/06/2022 0000   MCV 78.0 07/15/2012 1058   MCH 26.3 07/15/2012 1058   MCHC 33.7 07/15/2012 1058   RDW 15.5 07/15/2012 1058   Iron Studies No results found for: "IRON", "TIBC", "FERRITIN", "IRONPCTSAT" Lipid Panel     Component Value Date/Time   CHOL 134 07/12/2022 1055   TRIG 381 (H) 07/12/2022 1055   HDL 43 07/12/2022 1055   CHOLHDL 4.7 Ratio 03/28/2007 2157   VLDL NOT CALC mg/dL 16/04/9603 5409   LDLCALC 36 07/12/2022 1055   Hepatic Function Panel     Component Value Date/Time   PROT 6.9 11/20/2022 0833   ALBUMIN 4.5 11/20/2022 0833   AST 23 11/20/2022 0833   ALT 26 11/20/2022 0833   ALKPHOS 93 11/20/2022 0833   BILITOT 0.3 11/20/2022 0833      Component Value Date/Time   TSH 3.110 07/12/2022 1055   Nutritional Lab Results  Component Value Date   VD25OH 32.7 11/20/2022   VD25OH 18.0 (L) 07/12/2022    ASSOCIATED CONDITIONS ADDRESSED TODAY  ASSESSMENT AND PLAN  Problem List Items Addressed This Visit     Hypertension associated with diabetes ([name])   Obesity ([name])- Starting BMI 45.06/DATE 07/12/22   Diabetes mellitus ([name]) - Primary   Drug-induced constipation  Type 2 Diabetes Mellitus with other specified complication, with long-term current use of insulin HgbA1c is at goal. Last A1c was 6.0- slightly elevated from previous.  Has followed up regularly with Endocrinology team- Mounjaro increased to 12.5 mg weekly and then plan to DC Novolog and monitor closely.  CBGs: On CGM-Continuous Blood Glucose Monitor: 120-150 mg/dL Continuous Blood Glucose Monitor (morning): 140-150 mg/dL Continuous Blood Glucose Monitor (postprandial): 183-184 mg/dL -She has been on several other medications for diabetes and did not tolerate them including: Invokana (yeast infections), Afrezza (chronic cough), Victoza (ineffective), Metformin (GI issues).  On ARB. On Statin Regular eye exams  Episodes of hypoglycemia: no Medication(s): Mounjaro 12.5 mg SQ weekly Denies mass in  neck, dysphagia, dyspepsia, persistent hoarseness, abdominal pain, or N/V or diarrhea. Has annual eye exam. Mood is stable. Constipated and discussed bowel regimen with miralax daily and increasing fluid intake.    Tresiba 30 units nightly and Novolog SSI to continue unti on increased Mounjaro dose  Lab Results  Component Value Date   HGBA1C 6.0 (A) 01/16/2023   HGBA1C 5.5 10/10/2022   HGBA1C 8.6 06/06/2022   Lab Results  Component Value Date   LDLCALC 36 07/12/2022   CREATININE 0.65 11/20/2022   No results found for: "GFR"  Plan: Continue Mounjaro 12.5 mg SQ weekly and Tresiba per Endocrinology.  Continue working on nutrition plan to decrease simple carbohydrates, increase lean proteins and exercise to promote weight loss and improve glycemic control . Possible Urinary Tract Infection Reports of blood in urine and previous history of kidney infection. -Refer to primary care provider for urinalysis and potential treatment. -Consider further evaluation for kidney stones or other renal issues if infection is confirmed.  Hypertension Hypertension well controlled, asymptomatic, and no significant medication side effects noted.  Medication(s): Benicar 40 mg daily   Lasix 20 mg daily Renal function normal.   BP Readings from Last 3 Encounters:  02/21/23 97/65  01/16/23 111/74  01/09/23 108/73   Lab Results  Component Value Date   CREATININE 0.65 11/20/2022   CREATININE 0.7 06/06/2022   CREATININE 0.77 07/15/2012   No results found for: "GFR"  Plan: Continue all antihypertensives at current dosages. May need to decrease antihypertensive medications as continues on [facility] therapy.  Encouraged to increase fluids to promote good bowel and urinary health.  Continue to work on nutrition plan to promote weight loss and improve BP control.   Constipation Reports of constipation despite magnesium supplementation. -Increase water intake to 80 ounces daily. -Add psyllium husk  capsules and Miralax to regimen. -Consider dietary changes to increase fiber intake.   Sleep Disturbance Reports of exhaustion, difficulty falling asleep, and frequent waking. History of poor sleep and possible sleep apnea. -Consider trial of low-dose melatonin for sleep aid. -Refer for sleep study to evaluate for sleep apnea.  General Health Maintenance -Check magnesium levels at next lab visit. -Follow up in one month. ATTESTASTION STATEMENTS:  Reviewed by clinician on day of visit: allergies, medications, problem list, medical history, surgical history, family history, social history, and previous encounter notes.   I have personally spent 42 minutes total time today in preparation, patient care, nutritional counseling and documentation for this visit, including the following: review of clinical lab tests; review of medical tests/procedures/services.      Ryann Leavitt, PA-C

## 2023-02-21 ENCOUNTER — Encounter (INDEPENDENT_AMBULATORY_CARE_PROVIDER_SITE_OTHER): Payer: Self-pay | Admitting: Physician Assistant

## 2023-02-21 ENCOUNTER — Other Ambulatory Visit (INDEPENDENT_AMBULATORY_CARE_PROVIDER_SITE_OTHER): Payer: Self-pay | Admitting: Physician Assistant

## 2023-02-21 ENCOUNTER — Ambulatory Visit (INDEPENDENT_AMBULATORY_CARE_PROVIDER_SITE_OTHER): Payer: BC Managed Care – PPO | Admitting: Physician Assistant

## 2023-02-21 VITALS — BP 97/65 | HR 77 | Temp 97.7°F | Ht 63.0 in | Wt 225.0 lb

## 2023-02-21 DIAGNOSIS — Z7985 Long-term (current) use of injectable non-insulin antidiabetic drugs: Secondary | ICD-10-CM

## 2023-02-21 DIAGNOSIS — E538 Deficiency of other specified B group vitamins: Secondary | ICD-10-CM

## 2023-02-21 DIAGNOSIS — Z794 Long term (current) use of insulin: Secondary | ICD-10-CM

## 2023-02-21 DIAGNOSIS — G479 Sleep disorder, unspecified: Secondary | ICD-10-CM

## 2023-02-21 DIAGNOSIS — K5903 Drug induced constipation: Secondary | ICD-10-CM | POA: Diagnosis not present

## 2023-02-21 DIAGNOSIS — E1159 Type 2 diabetes mellitus with other circulatory complications: Secondary | ICD-10-CM

## 2023-02-21 DIAGNOSIS — E559 Vitamin D deficiency, unspecified: Secondary | ICD-10-CM

## 2023-02-21 DIAGNOSIS — Z6839 Body mass index (BMI) 39.0-39.9, adult: Secondary | ICD-10-CM

## 2023-02-21 DIAGNOSIS — E1169 Type 2 diabetes mellitus with other specified complication: Secondary | ICD-10-CM | POA: Diagnosis not present

## 2023-02-21 DIAGNOSIS — I152 Hypertension secondary to endocrine disorders: Secondary | ICD-10-CM

## 2023-02-21 DIAGNOSIS — E669 Obesity, unspecified: Secondary | ICD-10-CM

## 2023-02-21 MED ORDER — VITAMIN D (ERGOCALCIFEROL) 1.25 MG (50000 UNIT) PO CAPS
50000.0000 [IU] | ORAL_CAPSULE | ORAL | 0 refills | Status: DC
Start: 2023-02-21 — End: 2023-03-28

## 2023-02-22 DIAGNOSIS — R350 Frequency of micturition: Secondary | ICD-10-CM | POA: Diagnosis not present

## 2023-02-22 IMAGING — MG MM DIGITAL SCREENING BILAT W/ TOMO AND CAD
8 of 14 series · 8 of 40 positions shown · non-contrast
Comparison: Previous exam(s).

CLINICAL DATA: Screening.

EXAM:
DIGITAL SCREENING BILATERAL MAMMOGRAM WITH TOMOSYNTHESIS AND CAD
TECHNIQUE: Bilateral screening digital craniocaudal and mediolateral oblique
mammograms were obtained. Bilateral screening digital breast
tomosynthesis was performed. The images were evaluated with
computer-aided detection.

[R MLO synth-2D]
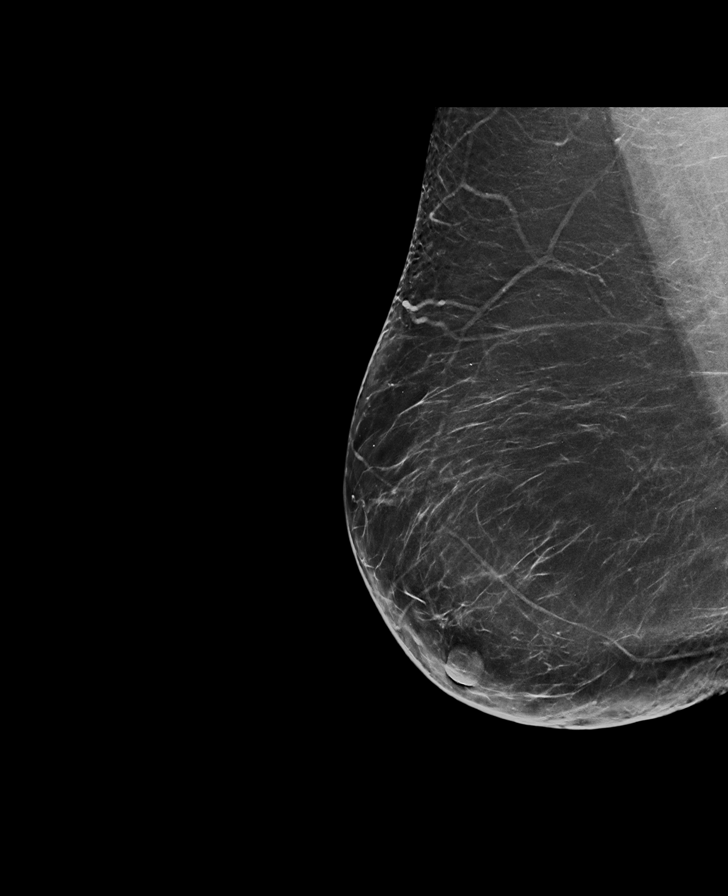

[L CC synth-2D (1 of 2)]
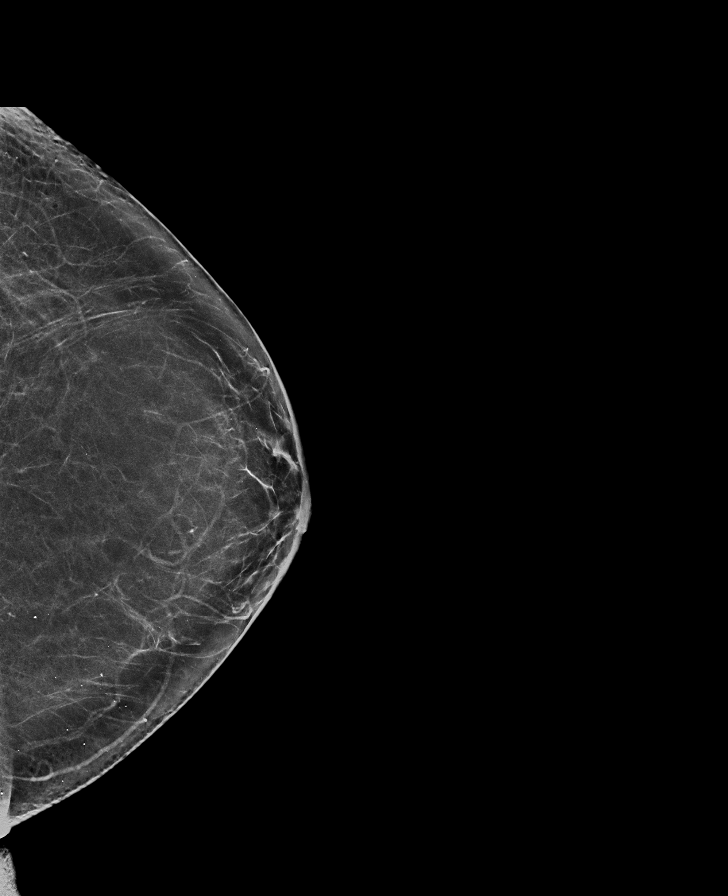

[L MLO synth-2D (1 of 2)]
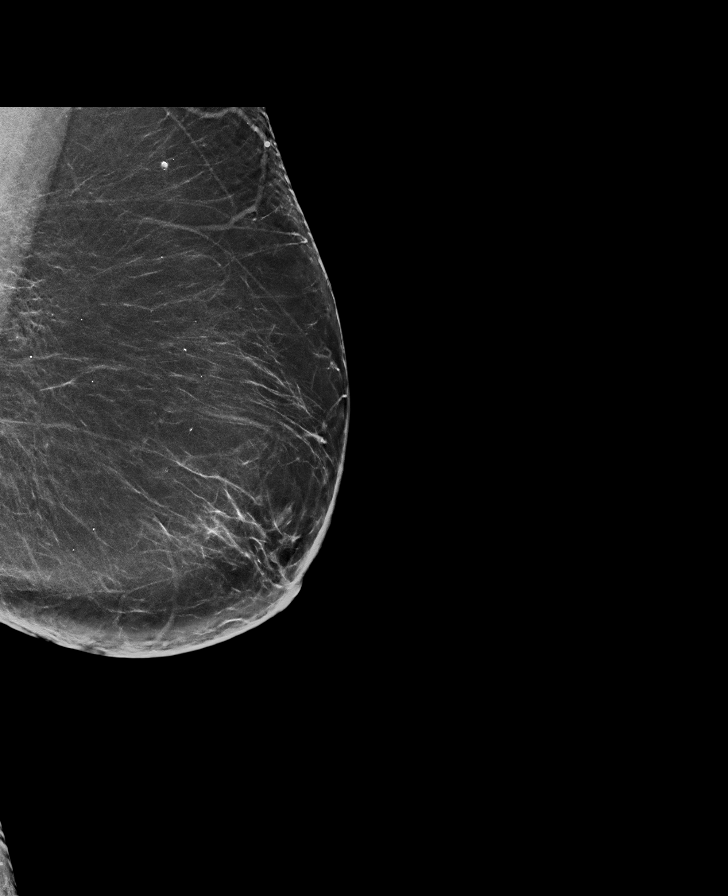

[L CC synth-2D (2 of 2)]
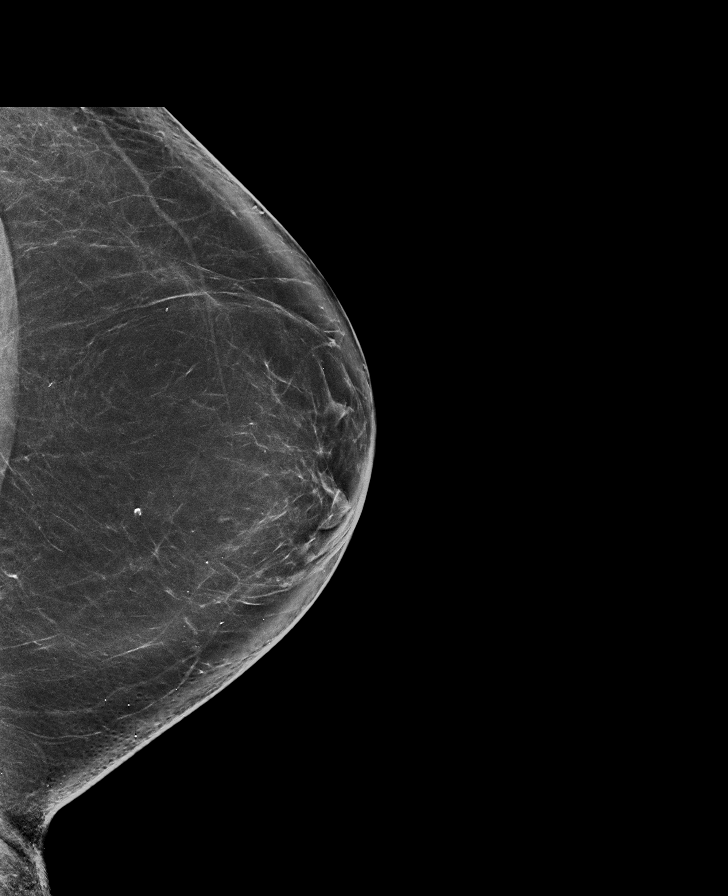

[R CC synth-2D (1 of 2)]
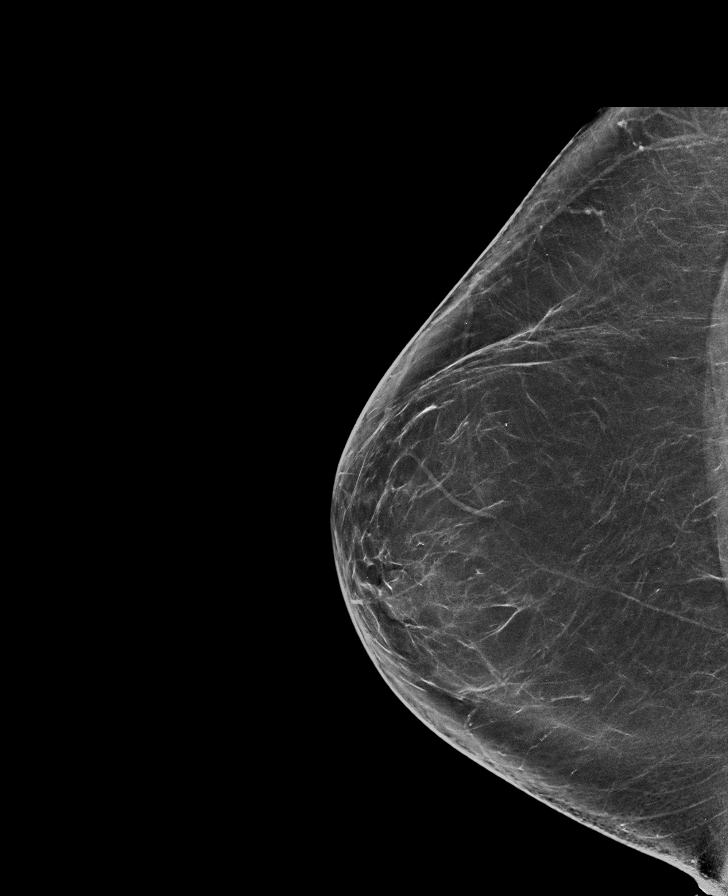

[L MLO synth-2D (2 of 2)]
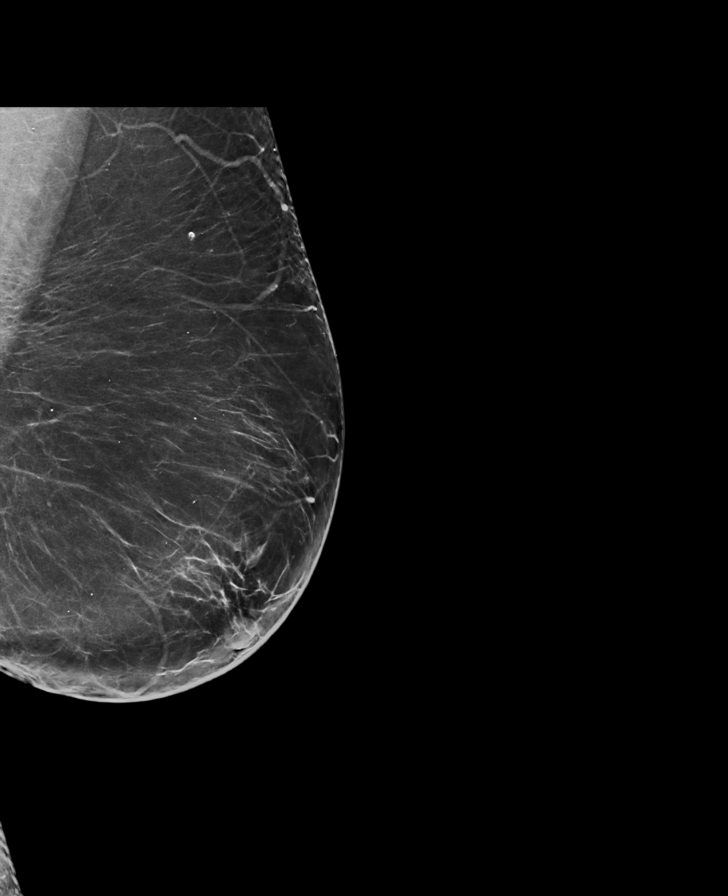

[R CC synth-2D (2 of 2)]
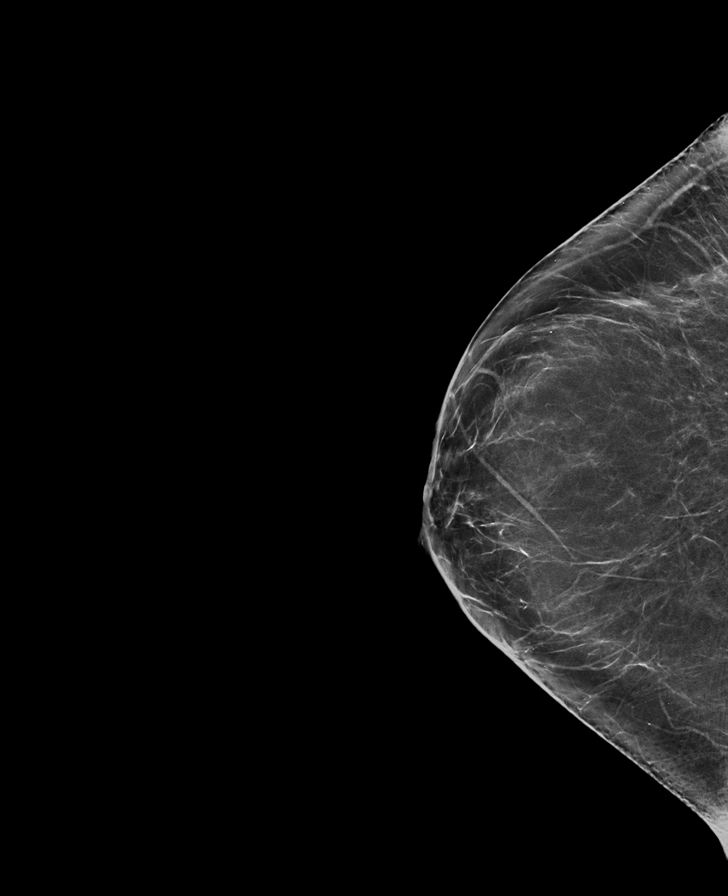

[R CC tomo · tomo slice 40/79.0]
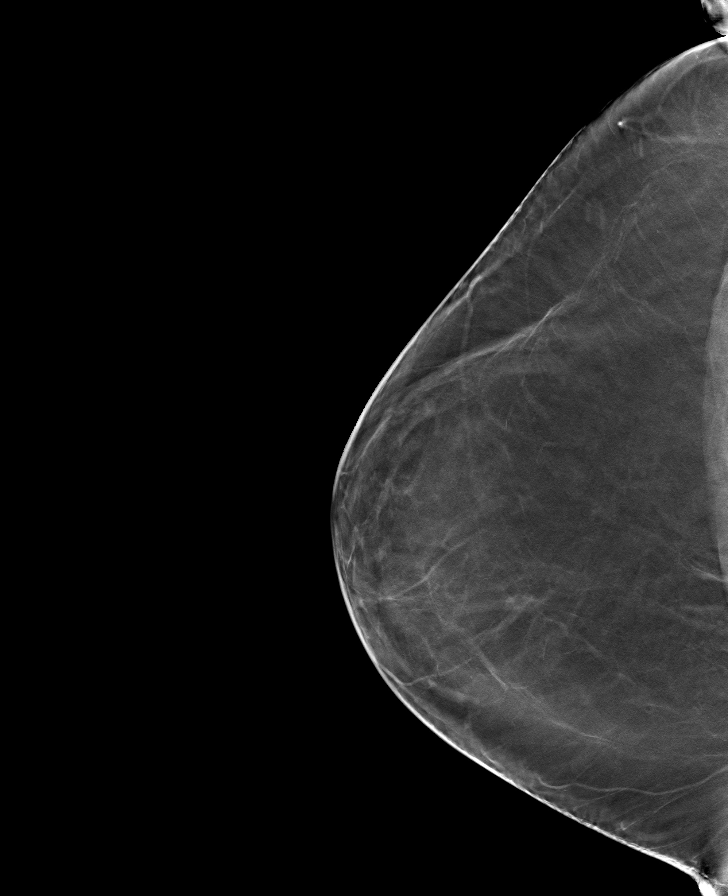

[8 of 40 positions shown; findings below may reference images not displayed]

ACR Breast Density Category b: There are scattered areas of
fibroglandular density.
FINDINGS: There are no findings suspicious for malignancy.
IMPRESSION: No mammographic evidence of malignancy. A result letter of this
screening mammogram will be mailed directly to the patient.

RECOMMENDATION:
Screening mammogram in one year. (Code:51-O-LD2)

BI-RADS CATEGORY  1: Negative.

## 2023-02-24 DIAGNOSIS — K5903 Drug induced constipation: Secondary | ICD-10-CM | POA: Insufficient documentation

## 2023-02-26 ENCOUNTER — Encounter: Payer: Self-pay | Admitting: Nurse Practitioner

## 2023-03-08 NOTE — Progress Notes (Unsigned)
Name: Loretta Luna DOB: 02/09/1971 MRN: [id number]  History of Present Illness: [name] is a 52 y.o. female who presents today as a new patient at [facility].  She reports chief complaint of gross hematuria with history of kidney stones.  - 09/25/2022: Abdominal US show tiny right angiomyolipoma; no renal stones, masses, or hydronephrosis. - 11/20/2022: GFR 105; creatinine 0.65 - 02/21/2023 note: "She has noticed blood in her urine and has a history of kidney stones. She also reports a recent episode of suspected kidney infection, which was treated with Cipro."  She reports that the visible hematuria was first noticed *** ago and {gen continuous/intermittent/once:313061} (***still ongoing ***?). She {Actions; denies-reports:120008} prior history of gross hematuria.   Today She {Actions; denies-reports:120008} urinary urgency, frequency, dysuria, straining to void, or sensations of incomplete emptying. She {Actions; denies-reports:120008} ***abdominal or ***flank pain. She {Actions; denies-reports:120008} fevers.  She {Actions; denies-reports:120008} history of pyelonephritis.  She {Actions; denies-reports:120008} history of recent or recurrent UTI. She {Actions; denies-reports:120008} history of GU malignancy or pelvic radiation.  She {Actions; denies-reports:120008} history of autoimmune disease. She {Actions; denies-reports:120008} history of smoking (quit ***; has smoked*** ppd x*** years). She {Actions; denies-reports:120008} known occupational risks. She {Actions; denies-reports:120008} recent vigorous exercise which they think may be contributory to hematuria. She {Actions; denies-reports:120008} any recent trauma or prolonged pressure to the perineal area. She {Actions; denies-reports:120008} recent illness. She {Actions; denies-reports:120008} taking anticoagulants (***).   Fall Screening: Do you usually have a device to assist in your mobility?  {yes/no:20286} ***cane / ***walker / ***wheelchair  Medications: Current Outpatient Medications  Medication Sig Dispense Refill   Bempedoic Acid-Ezetimibe (NEXLIZET) 180-10 MG TABS Take by mouth.     Continuous Blood Gluc Sensor (DEXCOM G7 SENSOR) MISC Change sensor every 10 days as directed. 9 each 3   cyanocobalamin (VITAMIN B12) 500 MCG tablet Take 1 tablet (500 mcg total) by mouth daily. (Patient taking differently: Take 1,000 mcg by mouth daily.)     furosemide (LASIX) 20 MG tablet Take 20 mg by mouth daily.     icosapent Ethyl (VASCEPA) 1 g capsule Take 2 g by mouth 2 (two) times daily.     loratadine (CLARITIN) 10 MG tablet Take 10 mg by mouth daily.     Magnesium 250 MG TABS May use CALM supplement- 325mg  mag 1-twice daily     olmesartan (BENICAR) 40 MG tablet Take 40 mg by mouth daily.     potassium chloride (KLOR-CON) 10 MEQ tablet Take 10 mEq by mouth 2 (two) times daily.     rosuvastatin (CRESTOR) 20 MG tablet Take 20 mg by mouth daily.     tirzepatide (MOUNJARO) 12.5 MG/0.5ML Pen Inject 12.5 mg into the skin once a week. 6 mL 1   TRESIBA FLEXTOUCH 200 UNIT/ML FlexTouch Pen Inject 30 Units into the skin at bedtime. 15 mL 3   Vitamin D, Ergocalciferol, (DRISDOL) 1.25 MG (50000 UNIT) CAPS capsule Take 1 capsule (50,000 Units total) by mouth every 7 (seven) days. 4 capsule 0   No current facility-administered medications for this visit.    Allergies: Allergies  Allergen Reactions   Food     shellfish   Iodine     REACTION: swelling, hives, tachypnea   Metformin And Related Diarrhea    Past Medical History:  Diagnosis Date   Anxiety    Asthma    Back pain    Chest pain    Constipation    Depression    Diabetes ([name])  Essential hypertension    Fatty liver    GERD (gastroesophageal reflux disease)    History of swelling of feet    Hypercalcemia    Hyperglycemia    Hypokalemia    IBS (irritable bowel syndrome)    Infertility, female    Insomnia    Joint pain     Mixed hyperlipidemia    Obesity    Pancreatic cyst    Polycystic ovarian syndrome    Proteinuria    Systolic murmur    Type 2 diabetes mellitus ([name])    Past Surgical History:  Procedure Laterality Date   CHOLECYSTECTOMY     Family History  Problem Relation Age of Onset   COPD Mother    Hyperlipidemia Mother    Hypertension Mother    Stroke Mother    Diabetes Mother    Heart disease Mother    Obesity Mother    Hyperlipidemia Father    Hypertension Father    Stroke Father    Diabetes Father    Sleep apnea Father    Obesity Father    Hypertension Sister    Asthma Brother    Hyperlipidemia Brother    Hypertension Brother    Cancer Maternal Grandmother    COPD Maternal Grandfather    Hyperlipidemia Maternal Grandfather    Cancer Paternal Grandmother    Stroke Paternal Grandfather    Social History   Socioeconomic History   Marital status: Single    Spouse name: Not on file   Number of children: Not on file   Years of education: Not on file   Highest education level: Not on file  Occupational History   Not on file  Tobacco Use   Smoking status: Never   Smokeless tobacco: Never  Substance and Sexual Activity   Alcohol use: No    Alcohol/week: 0.0 standard drinks of alcohol   Drug use: Not on file   Sexual activity: Not on file  Other Topics Concern   Not on file  Social History Narrative   Not on file   Social Determinants of Health   Financial Resource Strain: Not on file  Food Insecurity: Not on file  Transportation Needs: Not on file  Physical Activity: Insufficiently Active (05/05/2020)   Received from [facility]   Exercise Vital Sign    Days of Exercise per Week: 3 days    Minutes of Exercise per Session: 30 min  Stress: Not on file  Social Connections: Not on file  Intimate Partner Violence: Not At Risk (05/05/2020)   Received from [facility], [facility]   Humiliation, Afraid, Rape, and Kick questionnaire     Fear of Current or Ex-Partner: No    Emotionally Abused: No    Physically Abused: No    Sexually Abused: No    SUBJECTIVE  Review of Systems Constitutional: Patient ***denies any unintentional weight loss or change in strength lntegumentary: Patient ***denies any rashes or pruritus Eyes: Patient denies ***dry eyes ENT: Patient ***denies dry mouth Cardiovascular: Patient ***denies chest pain or syncope Respiratory: Patient ***denies shortness of breath Gastrointestinal: Patient ***denies nausea, vomiting, constipation, or diarrhea Musculoskeletal: Patient ***denies muscle cramps or weakness Neurologic: Patient ***denies convulsions or seizures Psychiatric: Patient ***denies memory problems Allergic/Immunologic: Patient ***denies recent allergic reaction(s) Hematologic/Lymphatic: Patient denies bleeding tendencies Endocrine: Patient ***denies heat/cold intolerance  GU: As per HPI.  OBJECTIVE There were no vitals filed for this visit. There is no height or weight on file to calculate  BMI.  Physical Examination Constitutional: ***No obvious distress; patient is ***non-toxic appearing  Cardiovascular: ***No visible lower extremity edema.  Respiratory: The patient does ***not have audible wheezing/stridor; respirations do ***not appear labored  Gastrointestinal: Abdomen ***non-distended Musculoskeletal: ***Normal ROM of UEs  Skin: ***No obvious rashes/open sores  Neurologic: CN 2-12 grossly ***intact Psychiatric: Answered questions ***appropriately with ***normal affect  Hematologic/Lymphatic/Immunologic: ***No obvious bruises or sites of spontaneous bleeding  UA: ***negative / *** WBC/hpf, *** RBC/hpf, bacteria (***) PVR: *** ml  ASSESSMENT No diagnosis found.  For management of gross hematuria, we discussed possible etiologies including but not limited to: vigorous exercise, sexual activity, stone, trauma, blood thinner use, urinary tract infection, kidney function, ***BPH,  ***radiation cystitis, malignancy. ***We discussed pt's smoking as a risk factor for GU cancer and encouraged ***continued smoking cessation.***  We discussed the importance of work-up including assessing the upper and lower GU tract with CT urogram and cystoscopy. We will also check ***CMP, ***CBC, and ***voided cytology.  We discussed the risk for clot retention and pt was advised to increase fluid intake to thin out clots. Pt was advised to go to the ER if they become unable to urinate due to clot retention, start having symptoms of anemia (which were discussed), or any other significant concerning acute symptoms.  Pt verbalized understanding and decided to pursue this work-up. Patient agreed to follow-up afterward to discuss the results and formulate a treatment plan based on the findings. All questions were answered.   PLAN Advised the following: CT ***ordered. Labs (CBC, CMP, ***PSA) ordered. Voided cytology ***ordered. 4. ***No follow-ups on file.  No orders of the defined types were placed in this encounter.   It has been explained that the patient is to follow regularly with their PCP in addition to all other providers involved in their care and to follow instructions provided by these respective offices. Patient advised to contact urology clinic if any urologic-pertaining questions, concerns, new symptoms or problems arise in the interim period.  There are no Patient Instructions on file for this visit.  Electronically signed by:  Donnita Falls, MSN, FNP-C, CUNP 03/08/2023 8:48 AM

## 2023-03-11 ENCOUNTER — Encounter: Payer: Self-pay | Admitting: Urology

## 2023-03-11 ENCOUNTER — Ambulatory Visit: Payer: BC Managed Care – PPO | Admitting: Urology

## 2023-03-11 VITALS — BP 113/76 | HR 79 | Temp 97.7°F

## 2023-03-11 DIAGNOSIS — Z87442 Personal history of urinary calculi: Secondary | ICD-10-CM | POA: Insufficient documentation

## 2023-03-11 DIAGNOSIS — N3941 Urge incontinence: Secondary | ICD-10-CM | POA: Diagnosis not present

## 2023-03-11 DIAGNOSIS — R310 Gross hematuria: Secondary | ICD-10-CM | POA: Diagnosis not present

## 2023-03-11 DIAGNOSIS — F40240 Claustrophobia: Secondary | ICD-10-CM | POA: Insufficient documentation

## 2023-03-11 DIAGNOSIS — F419 Anxiety disorder, unspecified: Secondary | ICD-10-CM

## 2023-03-11 DIAGNOSIS — R3129 Other microscopic hematuria: Secondary | ICD-10-CM | POA: Diagnosis not present

## 2023-03-11 DIAGNOSIS — Z91041 Radiographic dye allergy status: Secondary | ICD-10-CM

## 2023-03-11 LAB — URINALYSIS, ROUTINE W REFLEX MICROSCOPIC
Bilirubin, UA: NEGATIVE
Glucose, UA: NEGATIVE
Ketones, UA: NEGATIVE
Nitrite, UA: NEGATIVE
Specific Gravity, UA: 1.03 (ref 1.005–1.030)
Urobilinogen, Ur: 0.2 mg/dL (ref 0.2–1.0)
pH, UA: 6 (ref 5.0–7.5)

## 2023-03-11 LAB — MICROSCOPIC EXAMINATION: RBC, Urine: >30 /HPF — AB (ref 0–2)

## 2023-03-11 LAB — BLADDER SCAN AMB NON-IMAGING: Scan Result: 0

## 2023-03-11 MED ORDER — PREDNISONE 50 MG PO TABS
ORAL_TABLET | ORAL | 0 refills | Status: DC
Start: 2023-03-11 — End: 2023-05-21

## 2023-03-11 MED ORDER — DIAZEPAM 2 MG PO TABS
ORAL_TABLET | ORAL | 0 refills | Status: DC
Start: 2023-03-11 — End: 2023-03-11

## 2023-03-11 MED ORDER — DIPHENHYDRAMINE HCL 50 MG PO CAPS
ORAL_CAPSULE | ORAL | 0 refills | Status: DC
Start: 2023-03-11 — End: 2023-05-21

## 2023-03-11 MED ORDER — DIAZEPAM 2 MG PO TABS
ORAL_TABLET | ORAL | 0 refills | Status: DC
Start: 2023-03-11 — End: 2023-05-21

## 2023-03-12 LAB — CYTOLOGY, URINE

## 2023-03-13 ENCOUNTER — Telehealth: Payer: Self-pay

## 2023-03-13 LAB — URINE CULTURE

## 2023-03-13 NOTE — Telephone Encounter (Signed)
Patient made aware and voiced understanding.

## 2023-03-13 NOTE — Telephone Encounter (Signed)
-----   Message from Donnita Falls sent at 03/13/2023  8:49 AM EDT ----- Please notify patient: Negative urine culture, no antibiotic needed at this time. Thanks.

## 2023-03-14 NOTE — Addendum Note (Signed)
Addended by: Ferdinand Lango on: 03/14/2023 11:37 AM   Modules accepted: Orders

## 2023-03-20 ENCOUNTER — Encounter: Payer: Self-pay | Admitting: Nurse Practitioner

## 2023-03-21 DIAGNOSIS — E785 Hyperlipidemia, unspecified: Secondary | ICD-10-CM | POA: Diagnosis not present

## 2023-03-21 DIAGNOSIS — E1169 Type 2 diabetes mellitus with other specified complication: Secondary | ICD-10-CM | POA: Diagnosis not present

## 2023-03-22 LAB — NMR, LIPOPROFILE
Cholesterol, Total: 119 mg/dL (ref 100–199)
HDL Particle Number: 36.8 umol/L (ref >=30.5)
HDL-C: 43 mg/dL (ref >39)
LDL Particle Number: 586 nmol/L (ref <1000)
LDL Size: 19.7 nm — ABNORMAL LOW (ref >20.5)
LDL-C (NIH Calc): 40 mg/dL (ref 0–99)
LP-IR Score: 59 — ABNORMAL HIGH (ref <=45)
Small LDL Particle Number: 437 nmol/L (ref <=527)
Triglycerides: 233 mg/dL — ABNORMAL HIGH (ref 0–149)

## 2023-03-28 ENCOUNTER — Ambulatory Visit (INDEPENDENT_AMBULATORY_CARE_PROVIDER_SITE_OTHER): Payer: BC Managed Care – PPO | Admitting: Family Medicine

## 2023-03-28 ENCOUNTER — Encounter (INDEPENDENT_AMBULATORY_CARE_PROVIDER_SITE_OTHER): Payer: Self-pay | Admitting: Family Medicine

## 2023-03-28 VITALS — BP 110/78 | HR 77 | Temp 98.5°F | Ht 63.0 in | Wt 225.0 lb

## 2023-03-28 DIAGNOSIS — E559 Vitamin D deficiency, unspecified: Secondary | ICD-10-CM | POA: Diagnosis not present

## 2023-03-28 DIAGNOSIS — Z6841 Body Mass Index (BMI) 40.0 and over, adult: Secondary | ICD-10-CM

## 2023-03-28 DIAGNOSIS — E538 Deficiency of other specified B group vitamins: Secondary | ICD-10-CM | POA: Diagnosis not present

## 2023-03-28 DIAGNOSIS — E1169 Type 2 diabetes mellitus with other specified complication: Secondary | ICD-10-CM | POA: Diagnosis not present

## 2023-03-28 DIAGNOSIS — E1159 Type 2 diabetes mellitus with other circulatory complications: Secondary | ICD-10-CM | POA: Diagnosis not present

## 2023-03-28 DIAGNOSIS — I152 Hypertension secondary to endocrine disorders: Secondary | ICD-10-CM | POA: Diagnosis not present

## 2023-03-28 DIAGNOSIS — E669 Obesity, unspecified: Secondary | ICD-10-CM

## 2023-03-28 DIAGNOSIS — Z7985 Long-term (current) use of injectable non-insulin antidiabetic drugs: Secondary | ICD-10-CM

## 2023-03-28 DIAGNOSIS — Z6839 Body mass index (BMI) 39.0-39.9, adult: Secondary | ICD-10-CM

## 2023-03-28 DIAGNOSIS — Z794 Long term (current) use of insulin: Secondary | ICD-10-CM

## 2023-03-28 MED ORDER — VITAMIN D (ERGOCALCIFEROL) 1.25 MG (50000 UNIT) PO CAPS: 50000.0000 [IU] | ORAL_CAPSULE | ORAL | 0 refills | Status: DC

## 2023-03-28 NOTE — Progress Notes (Signed)
Loretta Luna, D.O.  ABFM, ABOM Specializing in Clinical Bariatric Medicine  Office located at: [address] [place], [place]  [address]     Assessment and Plan:   Orders Placed This Encounter  Procedures   VITAMIN D 25 Hydroxy (Vit-D Deficiency, Fractures)   Vitamin B12    Medications Discontinued During This Encounter  Medication Reason   Vitamin D, Ergocalciferol, (DRISDOL) 1.25 MG (50000 UNIT) CAPS capsule Reorder     Meds ordered this encounter  Medications   Vitamin D, Ergocalciferol, (DRISDOL) 1.25 MG (50000 UNIT) CAPS capsule    Sig: Take 1 capsule (50,000 Units total) by mouth every 7 (seven) days.    Dispense:  4 capsule    Refill:  0    Type 2 diabetes mellitus with other specified complication, with long-term current use of insulin Loretta Luna) Assessment & Plan: Lab Results  Component Value Date   HGBA1C 6.0 (A) 01/16/2023   HGBA1C 5.5 10/10/2022   HGBA1C 8.6 06/06/2022   INSULIN 17.4 07/12/2022   T2DM treated with Loretta Luna 12.5 mg once a wk & Tresiba 20 units in the morning & 20 units at night. Pt has concerns that the Loretta Luna is not working well for her. In the past 2 wks, her fasting blood sugars have been in the 140s. Had long discussion with pt about how carbohydrates, fibers, and proteins in foods affect blood sugars. We recommended she discuss with ENDO about further changes to medication, such as if increasing Loretta Luna would help her.    Hypertension associated with diabetes ([name]) Assessment & Plan: Last 3 blood pressure readings in our office are as follows: BP Readings from Last 3 Encounters:  03/28/23 110/78  03/11/23 113/76  02/21/23 97/65   HTN treated with Benicar & Hydrochlorothiazide. Blood pressure stable. No concerns in this regard today. C/w both antihypertensives per PCP/specialist & our low sodium diet, advance exercise as tolerated.    Vitamin D deficiency Assessment & Plan: Lab Results  Component Value Date   VD25OH  32.7 11/20/2022   VD25OH 18.0 (L) 07/12/2022   Endorses taking ERGO 50,000 units once a wk without any adverse effects. C/w their weight loss efforts and high-dose vit D - Will refill ERGO today. Recheck today.    B12 deficiency Assessment & Plan: Lab Results  Component Value Date   VITAMINB12 536 11/20/2022   Endorses taking OTC B12 1,000 mcg daily without any adverse effects. C/w B12 supplement and B12 rich prudent nutritional plan. Recheck today.    BMI 40.0-44.9, adult ([name]) Currrent BMI 39.87 Obesity ([name])- Starting BMI 45.06/DATE 07/12/22 Assessment & Plan: Since last office visit on 02/21/23 patient's muscle mass has decreased by 2.4 lb. Fat mass has increased by 2.2 lb. Total body water has increased by 2.6 lb.  Counseling done on how various foods will affect these numbers and how to maximize success  Total lbs lost to date: 29 lbs  Total weight loss percentage to date: 11.42%  No change to meal plan - see Subjective  I discussed with pt the potential benefits of tracking the foods they eat and drink. Journaling can help one further understand their eating habits and patterns and it's a great tool to make more conscious meal choices throughout the day. We may have her journal next OV.   Behavioral Intervention Additional resources provided today: n/a Evidence-based interventions for health behavior change were utilized today including the discussion of self monitoring techniques, problem-solving barriers and SMART goal setting techniques.  Regarding patient's less desirable eating habits and patterns, we employed the technique of small changes.  Pt will specifically work on: walking 5 days a wk for next visit.    FOLLOW UP: Return 04/18/23. She was informed of the importance of frequent follow up visits to maximize her success with intensive lifestyle modifications for her multiple health conditions.  Loretta Luna is aware that we will review all of her lab results at our  next visit.  She is aware that if anything is critical/ life threatening with the results, we will be contacting her via MyChart prior to the office visit to discuss management.    Subjective:   Chief complaint: Obesity Loretta Luna is here to discuss her progress with her obesity treatment plan. She is on the Category 3 Plan and states she is following her eating plan approximately 30% of the time. She states she is walking 30 minutes 3 days per week.  Interval History:  Loretta Luna is here for a follow up office visit. 4-5 wks ago, pt endorses that her blood sugars were increasing and out of frustration she ate out more and drank more diet sodas. In the last 2 wks, she endorses that her blood sugars have been more stable and is getting back on track with her meal plan. Hunger and cravings appear to be better controlled now.   Pharmacotherapy for weight loss: She is currently taking  Loretta Luna 12.5 mg once a week   for medical weight loss.  Denies side effects.    Review of Systems:  Pertinent positives were addressed with patient today.  Reviewed by clinician on day of visit: allergies, medications, problem list, medical history, surgical history, family history, social history, and previous encounter notes.  Weight Summary and Biometrics   Weight Lost Since Last Visit: 0lb  Weight Gained Since Last Visit: 0lb   Vitals Temp: 98.5 F (36.9 C) BP: 110/78 Pulse Rate: 77 SpO2: 98 %   Anthropometric Measurements Height: 5\' 3"  (1.6 m) Weight: 225 lb (102.1 kg) BMI (Calculated): 39.87 Weight at Last Visit: 225lb Weight Lost Since Last Visit: 0lb Weight Gained Since Last Visit: 0lb Starting Weight: 254lb Total Weight Loss (lbs): 29 lb (13.2 kg) Peak Weight: 289lb   Body Composition  Body Fat %: 46.3 % Fat Mass (lbs): 104.4 lbs Muscle Mass (lbs): 115 lbs Total Body Water (lbs): 83 lbs Visceral Fat Rating : 14   Other Clinical Data Fasting: no Labs: no Today's Visit #:  12 Starting Date: 07/12/22   Objective:   PHYSICAL EXAM: Blood pressure 110/78, pulse 77, temperature 98.5 F (36.9 C), height 5\' 3"  (1.6 m), weight 225 lb (102.1 kg), SpO2 98%. Body mass index is 39.86 kg/m.  General: Well Developed, well nourished, and in no acute distress.  HEENT: Normocephalic, atraumatic Skin: Warm and dry, cap RF less 2 sec, good turgor Chest:  Normal excursion, shape, no gross abn Respiratory: speaking in full sentences, no conversational dyspnea NeuroM-Sk: Ambulates w/o assistance, moves * 4 Psych: A and O *3, insight good, mood-full  DIAGNOSTIC DATA REVIEWED:  BMET    Component Value Date/Time   NA 142 11/20/2022 0833   K 4.4 11/20/2022 0833   CL 102 11/20/2022 0833   CO2 23 11/20/2022 0833   GLUCOSE 118 (H) 11/20/2022 0833   GLUCOSE 120 (H) 07/15/2012 1058   BUN 16 11/20/2022 0833   CREATININE 0.65 11/20/2022 0833   CREATININE 0.77 07/15/2012 1058   CALCIUM 10.5 (H) 11/20/2022 3086  Lab Results  Component Value Date   HGBA1C 6.0 (A) 01/16/2023   HGBA1C 5.5 07/01/2006   Lab Results  Component Value Date   INSULIN 17.4 07/12/2022   Lab Results  Component Value Date   TSH 3.110 07/12/2022   CBC    Component Value Date/Time   WBC 7.2 06/06/2022 0000   WBC 8.2 07/15/2012 1058   RBC 5.81 (A) 06/06/2022 0000   HGB 15.8 06/06/2022 0000   HCT 49 (A) 06/06/2022 0000   PLT 317 06/06/2022 0000   MCV 78.0 07/15/2012 1058   MCH 26.3 07/15/2012 1058   MCHC 33.7 07/15/2012 1058   RDW 15.5 07/15/2012 1058   Iron Studies No results found for: "IRON", "TIBC", "FERRITIN", "IRONPCTSAT" Lipid Panel     Component Value Date/Time   CHOL 134 07/12/2022 1055   TRIG 381 (H) 07/12/2022 1055   HDL 43 07/12/2022 1055   CHOLHDL 4.7 Ratio 03/28/2007 2157   VLDL NOT CALC mg/dL 74/25/9563 8756   LDLCALC 36 07/12/2022 1055   Hepatic Function Panel     Component Value Date/Time   PROT 6.9 11/20/2022 0833   ALBUMIN 4.5 11/20/2022 0833   AST 23  11/20/2022 0833   ALT 26 11/20/2022 0833   ALKPHOS 93 11/20/2022 0833   BILITOT 0.3 11/20/2022 0833      Component Value Date/Time   TSH 3.110 07/12/2022 1055   Nutritional Lab Results  Component Value Date   VD25OH 32.7 11/20/2022   VD25OH 18.0 (L) 07/12/2022    Attestations:   Patient was in the office today and time spent on visit including pre-visit chart review and post-visit care/coordination of care and electronic medical record documentation was 40 minutes. 50% of the time was in face to face counseling of this patient's medical condition(s) and providing education on treatment options to include the first-line treatment of diet and lifestyle modification.  I, Special Randolm Idol, acting as a Stage manager for [employer], DO., have compiled all relevant documentation for today's office visit on behalf of Thomasene Lot, DO, while in the presence of [employer], DO.  I have reviewed the above documentation for accuracy and completeness, and I agree with the above. Loretta Luna, D.O.  The 21st Century Cures Act was signed into law in 2016 which includes the topic of electronic health records.  This provides immediate access to information in MyChart.  This includes consultation notes, operative notes, office notes, lab results and pathology reports.  If you have any questions about what you read please let us know at your next visit so we can discuss your concerns and take corrective action if need be.  We are right here with you.

## 2023-03-29 ENCOUNTER — Ambulatory Visit (HOSPITAL_BASED_OUTPATIENT_CLINIC_OR_DEPARTMENT_OTHER): Payer: BC Managed Care – PPO | Admitting: Internal Medicine

## 2023-03-29 ENCOUNTER — Encounter (HOSPITAL_BASED_OUTPATIENT_CLINIC_OR_DEPARTMENT_OTHER): Payer: Self-pay | Admitting: Internal Medicine

## 2023-03-29 VITALS — BP 106/54 | HR 86 | Ht 63.0 in | Wt 226.0 lb

## 2023-03-29 DIAGNOSIS — E785 Hyperlipidemia, unspecified: Secondary | ICD-10-CM | POA: Diagnosis not present

## 2023-03-29 DIAGNOSIS — I10 Essential (primary) hypertension: Secondary | ICD-10-CM | POA: Diagnosis not present

## 2023-03-29 DIAGNOSIS — E1169 Type 2 diabetes mellitus with other specified complication: Secondary | ICD-10-CM

## 2023-03-29 DIAGNOSIS — E7849 Other hyperlipidemia: Secondary | ICD-10-CM

## 2023-03-29 LAB — VITAMIN D 25 HYDROXY (VIT D DEFICIENCY, FRACTURES): Vit D, 25-Hydroxy: 56.7 ng/mL (ref 30.0–100.0)

## 2023-03-29 LAB — VITAMIN B12: Vitamin B-12: 708 pg/mL (ref 232–1245)

## 2023-03-29 MED ORDER — OLMESARTAN MEDOXOMIL 20 MG PO TABS: 20.0000 mg | ORAL_TABLET | Freq: Every day | ORAL | 3 refills | Status: DC

## 2023-03-29 NOTE — Progress Notes (Signed)
LIPID CLINIC CONSULT NOTE  Chief Complaint:  Follow-up dyslipidemia  Primary Care Physician: Benita Stabile, MD  Primary Cardiologist:  Nona Dell, MD  HPI:  Loretta Luna is a 52 y.o. female who is being seen today for the evaluation of dyslipidemia at the request of Benita Stabile, MD. this is a pleasant 52 year old female kindly referred for evaluation management of dyslipidemia.  Primarily she has had a history of very high triglycerides which she reports has been over 3000 in the past.  More recently lab work shows that her triglycerides have been in the 800s.  She has been primarily managed by her PCP who has her on quite a few cardiovascular/lipid-lowering medications, including Vascepa, rosuvastatin, Repatha and Nexlizet.  Her last labs in August showed total cholesterol 109, HDL 39, triglycerides 476 and LDL of 8, apparently then she started the Repatha subsequent to these labs.  She is not currently on a fibrate but reports having taken it in the past although is unclear why it was discontinued.  I did receive referral information from her primary care doctor.  She has been previously diagnosed with familial combined hyperlipidemia.  She did have a calcium score which reportedly showed no coronary calcium.  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  09/07/2022  Loretta Luna returns today for follow-up of her dyslipidemia.  Previously I felt that she was being overtreated on 5 different cholesterol medications.  I advised her to stop her Repatha as her cholesterol was essentially undetectable.  Her repeat labs are much more reasonable.  LDL particle number now 529 with an LDL-C of 35, HDL-C43, triglycerides lower at 203 and a small LDL particle number of 377.  Overall a very good-looking lipid profile.  She was happy to be without the additional medication.  She also  has been having some concerns with fatigue and recently had been noted to have some low blood pressure.  She has lost about 10 pounds after working with the [facility].  Her blood pressures recently have been in the 80s and 90s systolic.  Today blood pressure was 92/62.   03/29/2023  Loretta Luna is seen today in follow-up of dyslipidemia.  Overall cholesterol continues to be well treated.  LDL particle #586, LDL 40, HDL 43 and triglycerides 233.  Triglycerides have been fairly variable.  Overall her treatment is very good.  She continues to have issues with low blood pressure.  I previously stopped her chlorthalidone.  She is lost about 30 pounds through the [facility] weight loss program.  I suspect this is why worsening lower blood pressures.  She has had some fatigue.  She does have HCTZ on her list to use as needed.  PMHx:  Past Medical History:  Diagnosis Date   Anxiety    Asthma    Back pain    Chest pain    Constipation    Depression    Diabetes ([name])    Essential hypertension    Fatty liver    GERD (gastroesophageal reflux disease)    History of swelling of feet    Hypercalcemia    Hyperglycemia    Hypokalemia    IBS (irritable bowel syndrome)    Infertility, female    Insomnia    Joint pain    Mixed hyperlipidemia    Obesity    Pancreatic cyst    Polycystic  ovarian syndrome    Proteinuria    Systolic murmur    Type 2 diabetes mellitus ([name])     Past Surgical History:  Procedure Laterality Date   CHOLECYSTECTOMY      FAMHx:  Family History  Problem Relation Age of Onset   COPD Mother    Hyperlipidemia Mother    Hypertension Mother    Stroke Mother    Diabetes Mother    Heart disease Mother    Obesity Mother    Hyperlipidemia Father    Hypertension Father    Stroke Father    Diabetes Father    Sleep apnea Father    Obesity Father    Hypertension Sister    Asthma Brother    Hyperlipidemia Brother    Hypertension Brother    Cancer Maternal  Grandmother    COPD Maternal Grandfather    Hyperlipidemia Maternal Grandfather    Cancer Paternal Grandmother    Stroke Paternal Grandfather     SOCHx:   reports that she has never smoked. She has never used smokeless tobacco. She reports that she does not drink alcohol and does not use drugs.  ALLERGIES:  Allergies  Allergen Reactions   Food     shellfish   Iodine     REACTION: swelling, hives, tachypnea   Metformin And Related Diarrhea    ROS: Pertinent items noted in HPI and remainder of comprehensive ROS otherwise negative.  HOME MEDS: Current Outpatient Medications on File Prior to Visit  Medication Sig Dispense Refill   Bempedoic Acid-Ezetimibe (NEXLIZET) 180-10 MG TABS Take by mouth.     ciclopirox (PENLAC) 8 % solution Apply topically.     Continuous Blood Gluc Sensor (DEXCOM G7 SENSOR) MISC Change sensor every 10 days as directed. 9 each 3   cyanocobalamin (VITAMIN B12) 500 MCG tablet Take 1 tablet (500 mcg total) by mouth daily. (Patient taking differently: Take 1,000 mcg by mouth daily.)     diazepam (VALIUM) 2 MG tablet Take 1 tablet 30 minutes prior to CT study. 1 tablet 0   diphenhydrAMINE (BENADRYL) 50 MG capsule Dispense #1. Take 1 capsule by mouth 1 hour before exam. 1 capsule 0   HYDROCHLOROTHIAZIDE PO Take by mouth.     icosapent Ethyl (VASCEPA) 1 g capsule Take 2 g by mouth 2 (two) times daily.     loratadine (CLARITIN) 10 MG tablet Take 10 mg by mouth daily.     Magnesium 250 MG TABS May use CALM supplement- 325mg  mag 1-twice daily     olmesartan (BENICAR) 40 MG tablet Take 40 mg by mouth daily.     potassium chloride (KLOR-CON) 10 MEQ tablet Take 10 mEq by mouth 2 (two) times daily.     predniSONE (DELTASONE) 50 MG tablet Dispense #3. Take 1 tablet by mouth 13 hours prior to exam, take 1 tab by mouth 7 hours prior to exam, then take 1 tablet by mouth 1 hour prior to exam. 3 tablet 0   rosuvastatin (CRESTOR) 20 MG tablet Take 20 mg by mouth daily.      tirzepatide (MOUNJARO) 12.5 MG/0.5ML Pen Inject 12.5 mg into the skin once a week. 6 mL 1   TRESIBA FLEXTOUCH 200 UNIT/ML FlexTouch Pen Inject 30 Units into the skin at bedtime. (Patient taking differently: Inject 30 Units into the skin at bedtime. 20 in morning, 20 at night) 15 mL 3   Vitamin D, Ergocalciferol, (DRISDOL) 1.25 MG (50000 UNIT) CAPS capsule Take 1 capsule (50,000 Units total) by mouth  every 7 (seven) days. 4 capsule 0   No current facility-administered medications on file prior to visit.    LABS/IMAGING: Results for orders placed or performed in visit on 03/28/23 (from the past 48 hour(s))  VITAMIN D 25 Hydroxy (Vit-D Deficiency, Fractures)     Status: None   Collection Time: 03/28/23  9:15 AM  Result Value Ref Range   Vit D, 25-Hydroxy 56.7 30.0 - 100.0 ng/mL    Comment: Vitamin D deficiency has been defined by the Institute of Medicine and an Endocrine Society practice guideline as a level of serum 25-OH vitamin D less than 20 ng/mL (1,2). The Endocrine Society went on to further define vitamin D insufficiency as a level between 21 and 29 ng/mL (2). 1. IOM (Institute of Medicine). 2010. Dietary reference    intakes for calcium and D. [place]: The    [employer]. 2. Holick MF, [place], Bischoff-Ferrari HA, et al.    Evaluation, treatment, and prevention of vitamin D    deficiency: an Endocrine Society clinical practice    guideline. JCEM. 2011 Jul; 96(7):1911-30.   Vitamin B12     Status: None   Collection Time: 03/28/23  9:15 AM  Result Value Ref Range   Vitamin B-12 708 232 - 1,245 pg/mL   No results found.  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   Loretta Luna has well treated dyslipidemia.  I would not recommend any changes to her regimen.  With regards to low blood pressure, I would advise decreasing her olmesartan from 40 to 20 mg daily.  Will give her a new prescription but currently she could cut her tablets in half.  She should monitor blood pressure and use her HCTZ sparingly.  She notes that when she takes that she has some back pain but she is also had some hematuria and has a follow-up with Dr. Retta Diones.  I suspect she has a kidney stone.  Follow-up with me in 6 months or sooner as necessary.  Chrystie Nose, MD, [facility], FACP  [place]  [facility]  Medical Director of the Advanced Lipid Disorders &  Cardiovascular Risk Reduction Clinic Diplomate of the American Board of Clinical Lipidology Attending Cardiologist  Direct Dial: [phone]  Fax: [phone]  Website:  [email or web address] Loretta Luna 03/29/2023, 9:06 AM

## 2023-03-29 NOTE — Patient Instructions (Signed)
Medication Instructions:  Your physician has recommended you make the following change in your medication:   Change: Benicar 20mg  tablet daily  *If you need a refill on your cardiac medications before your next appointment, please call your pharmacy*   Lab Work: Your physician recommends that you return for lab work one week before follow up appointment Fasting NMR   Follow-Up: At [facility], you and your health needs are our priority.  As part of our continuing mission to provide you with exceptional heart care, we have created designated Provider Care Teams.  These Care Teams include your primary Cardiologist (physician) and Advanced Practice Providers (APPs -  Physician Assistants and Nurse Practitioners) who all work together to provide you with the care you need, when you need it.  We recommend signing up for the patient portal called "MyChart".  Sign up information is provided on this After Visit Summary.  MyChart is used to connect with patients for Virtual Visits (Telemedicine).  Patients are able to view lab/test results, encounter notes, upcoming appointments, etc.  Non-urgent messages can be sent to your provider as well.   To learn more about what you can do with MyChart, go to [email or web address].    Your next appointment:   6 months with Dr. Rennis Golden in Lipid Clinic

## 2023-04-02 ENCOUNTER — Encounter: Payer: Self-pay | Admitting: Urology

## 2023-04-02 ENCOUNTER — Ambulatory Visit: Payer: BC Managed Care – PPO | Admitting: Urology

## 2023-04-02 VITALS — BP 133/78 | HR 83

## 2023-04-02 DIAGNOSIS — R310 Gross hematuria: Secondary | ICD-10-CM

## 2023-04-02 MED ORDER — CIPROFLOXACIN HCL 500 MG PO TABS
500.0000 mg | ORAL_TABLET | Freq: Once | ORAL | Status: AC
Start: 2023-04-02 — End: 2023-04-02
  Administered 2023-04-02: 500 mg via ORAL

## 2023-04-02 NOTE — Progress Notes (Signed)
History of Present Illness: Loretta Luna is a 52 y.o. year old female here for cysto for hematuria evaluation. She has had CT A/P ordered but has not had this done yet.  Past Medical History:  Diagnosis Date   Anxiety    Asthma    Back pain    Chest pain    Constipation    Depression    Diabetes ([name])    Essential hypertension    Fatty liver    GERD (gastroesophageal reflux disease)    History of swelling of feet    Hypercalcemia    Hyperglycemia    Hypokalemia    IBS (irritable bowel syndrome)    Infertility, female    Insomnia    Joint pain    Mixed hyperlipidemia    Obesity    Pancreatic cyst    Polycystic ovarian syndrome    Proteinuria    Systolic murmur    Type 2 diabetes mellitus ([name])     Past Surgical History:  Procedure Laterality Date   CHOLECYSTECTOMY      Home Medications:  (Not in a hospital admission)   Allergies:  Allergies  Allergen Reactions   Food     shellfish   Iodine     REACTION: swelling, hives, tachypnea   Metformin And Related Diarrhea    Family History  Problem Relation Age of Onset   COPD Mother    Hyperlipidemia Mother    Hypertension Mother    Stroke Mother    Diabetes Mother    Heart disease Mother    Obesity Mother    Hyperlipidemia Father    Hypertension Father    Stroke Father    Diabetes Father    Sleep apnea Father    Obesity Father    Hypertension Sister    Asthma Brother    Hyperlipidemia Brother    Hypertension Brother    Cancer Maternal Grandmother    COPD Maternal Grandfather    Hyperlipidemia Maternal Grandfather    Cancer Paternal Grandmother    Stroke Paternal Grandfather     Social History:  reports that she has never smoked. She has never used smokeless tobacco. She reports that she does not drink alcohol and does not use drugs.  ROS: A complete review of systems was performed.  All systems are negative except for pertinent findings as noted.  Physical Exam:  Vital signs in last 24  hours: @VSRANGES @ General:  Alert and oriented, No acute distress HEENT: Normocephalic, atraumatic Neck: No JVD or lymphadenopathy Cardiovascular: Regular rate  Lungs: Normal inspiratory/expiratory excursion Neurologic: Grossly intact  I have reviewed prior pt notes  I have reviewed urinalysis results  I have reviewed prior urine culture   Cystoscopy Procedure Note:  Indication: Hematuria  After informed consent and discussion of the procedure and its risks, MABELL ESGUERRA was positioned and prepped in the standard fashion.  Cystoscopy was performed with a flexible cystoscope.   Findings: Urethra: Normal Ureteral orifices: Normally positioned and configured Bladder: Normal urothelium.  No foreign bodies.  The patient tolerated the procedure well.     Impression/Assessment:  History of hematuria with negative cystoscopy, CT pending  Plan:  We will call with CT results and follow-up if necessary  Chelsea Aus 04/02/2023, 9:46 AM  Bertram Millard. Marianela Mandrell MD

## 2023-04-03 LAB — MICROSCOPIC EXAMINATION

## 2023-04-03 LAB — URINALYSIS, ROUTINE W REFLEX MICROSCOPIC
Bilirubin, UA: NEGATIVE
Glucose, UA: NEGATIVE
Ketones, UA: NEGATIVE
Nitrite, UA: NEGATIVE
Specific Gravity, UA: 1.025 (ref 1.005–1.030)
Urobilinogen, Ur: 0.2 mg/dL (ref 0.2–1.0)
pH, UA: 6 (ref 5.0–7.5)

## 2023-04-18 ENCOUNTER — Ambulatory Visit (INDEPENDENT_AMBULATORY_CARE_PROVIDER_SITE_OTHER): Payer: BC Managed Care – PPO | Admitting: Physician Assistant

## 2023-04-18 ENCOUNTER — Encounter (INDEPENDENT_AMBULATORY_CARE_PROVIDER_SITE_OTHER): Payer: Self-pay | Admitting: Physician Assistant

## 2023-04-18 VITALS — BP 103/59 | HR 76 | Temp 98.0°F | Ht 63.0 in | Wt 223.0 lb

## 2023-04-18 DIAGNOSIS — E1169 Type 2 diabetes mellitus with other specified complication: Secondary | ICD-10-CM | POA: Diagnosis not present

## 2023-04-18 DIAGNOSIS — E1159 Type 2 diabetes mellitus with other circulatory complications: Secondary | ICD-10-CM | POA: Diagnosis not present

## 2023-04-18 DIAGNOSIS — I152 Hypertension secondary to endocrine disorders: Secondary | ICD-10-CM

## 2023-04-18 DIAGNOSIS — K5904 Chronic idiopathic constipation: Secondary | ICD-10-CM

## 2023-04-18 DIAGNOSIS — Z7984 Long term (current) use of oral hypoglycemic drugs: Secondary | ICD-10-CM

## 2023-04-18 DIAGNOSIS — Z7985 Long-term (current) use of injectable non-insulin antidiabetic drugs: Secondary | ICD-10-CM

## 2023-04-18 DIAGNOSIS — E559 Vitamin D deficiency, unspecified: Secondary | ICD-10-CM

## 2023-04-18 DIAGNOSIS — E669 Obesity, unspecified: Secondary | ICD-10-CM

## 2023-04-18 DIAGNOSIS — R319 Hematuria, unspecified: Secondary | ICD-10-CM

## 2023-04-18 DIAGNOSIS — Z6839 Body mass index (BMI) 39.0-39.9, adult: Secondary | ICD-10-CM

## 2023-04-18 DIAGNOSIS — Z794 Long term (current) use of insulin: Secondary | ICD-10-CM

## 2023-04-18 MED ORDER — VITAMIN D (ERGOCALCIFEROL) 1.25 MG (50000 UNIT) PO CAPS: 50000.0000 [IU] | ORAL_CAPSULE | ORAL | 0 refills | Status: DC

## 2023-04-18 NOTE — Progress Notes (Signed)
.smr  Office: [phone]  /  Fax: [phone]  WEIGHT SUMMARY AND BIOMETRICS  Vitals Temp: 98 F (36.7 C) BP: (!) 103/59 Pulse Rate: 76 SpO2: 96 %   Anthropometric Measurements Height: 5\' 3"  (1.6 m) Weight: 223 lb (101.2 kg) BMI (Calculated): 39.51 Weight at Last Visit: 225 lb Weight Lost Since Last Visit: 2 lb Weight Gained Since Last Visit: 0 Starting Weight: 254 lb Total Weight Loss (lbs): 31 lb (14.1 kg) Peak Weight: 289 lb   Body Composition  Body Fat %: 45.6 % Fat Mass (lbs): 102 lbs Muscle Mass (lbs): 115.6 lbs Total Body Water (lbs): 80.4 lbs Visceral Fat Rating : 14   Other Clinical Data Fasting: yes Labs: no Today's Visit #: 14 Starting Date: 07/12/22     HPI  Chief Complaint: OBESITY  Loretta Luna is here to discuss her progress with her obesity treatment plan. She is on the the Category 3 Plan and states she is following her eating plan approximately 50 % of the time. She states she is exercising walking 30 minutes 3 times per week.   Interval History:  Since last office visit she is down 2 lbs.  Bio impedence scale reviewed with the patient:  Muscle mass + 0.6 lbs Adipose mass - 2.4 lbs.  Total water weight - 2.6 lbs.  Down a total of 31 lb since 07/12/2022 TBW loss of 12.2% !! The patient, with a history of obesity, type 2 diabetes, hypertension, and vitamin D deficiency, presents for a follow-up visit.  She reports recent issues with hematuria, which is currently under investigation by urology.  She has been trying to focus on getting adequate protein and improving water intake.  She continues to walk regularly.  Stress levels remain high, but she did take several days off from work and this has helped to allow her some time to relax.   Pharmacotherapy: Mounjaro 12.5 mg weekly. Denies mass in neck, dysphagia, dyspepsia, persistent hoarseness, abdominal pain, or N/V/or diarrhea. Has annual eye exam. Mood is stable. Constipated and having little  results with laxative. Discussed use of miralax daily and importance of adequate fluid intake for bowel and urinary health.  She has been taking   TREATMENT PLAN FOR OBESITY:  Significant weight loss of 31 pounds. TBW loss of 12.2% -Continue current nutrition plan and exercise regimen to promote weight loss.  Recommended Dietary Goals  Loretta Luna is currently in the action stage of change. As such, her goal is to continue weight management plan. She has agreed to the Category 3 Plan.  Behavioral Intervention  We discussed the following Behavioral Modification Strategies today: continue to work on maintaining a reduced calorie state, getting the recommended amount of protein, incorporating whole foods, making healthy choices, staying well hydrated and practicing mindfulness when eating..  Additional resources provided today: NA  Recommended Physical Activity Goals  Loretta Luna has been advised to work up to 150 minutes of moderate intensity aerobic activity a week and strengthening exercises 2-3 times per week for cardiovascular health, weight loss maintenance and preservation of muscle mass.   She has agreed to Think about enjoyable ways to increase daily physical activity and overcoming barriers to exercise and Increase physical activity in their day and reduce sedentary time (increase NEAT).   Pharmacotherapy We discussed various medication options to help Loretta Luna with her weight loss efforts and we both agreed to continue Mounjaro 12.5 mg weekly  .    Return in about 3 weeks (around 05/09/2023) for Fasting Lab.Marland Kitchen She was informed  of the importance of frequent follow up visits to maximize her success with intensive lifestyle modifications for her multiple health conditions.  PHYSICAL EXAM:  Blood pressure (!) 103/59, pulse 76, temperature 98 F (36.7 C), height 5\' 3"  (1.6 m), weight 223 lb (101.2 kg), SpO2 96%. Body mass index is 39.5 kg/m.  General: She is overweight, cooperative, alert,  well developed, and in no acute distress. PSYCH: Has normal mood, affect and thought process.   Cardiovascular: HR 70's BP 103/59 Lungs: Normal breathing effort, no conversational dyspnea. Neuro: no focal deficit  DIAGNOSTIC DATA REVIEWED:  BMET    Component Value Date/Time   NA 142 11/20/2022 0833   K 4.4 11/20/2022 0833   CL 102 11/20/2022 0833   CO2 23 11/20/2022 0833   GLUCOSE 118 (H) 11/20/2022 0833   GLUCOSE 120 (H) 07/15/2012 1058   BUN 16 11/20/2022 0833   CREATININE 0.65 11/20/2022 0833   CREATININE 0.77 07/15/2012 1058   CALCIUM 10.5 (H) 11/20/2022 0833   Lab Results  Component Value Date   HGBA1C 6.0 (A) 01/16/2023   HGBA1C 5.5 07/01/2006   Lab Results  Component Value Date   INSULIN 17.4 07/12/2022   Lab Results  Component Value Date   TSH 3.110 07/12/2022   CBC    Component Value Date/Time   WBC 7.2 06/06/2022 0000   WBC 8.2 07/15/2012 1058   RBC 5.81 (A) 06/06/2022 0000   HGB 15.8 06/06/2022 0000   HCT 49 (A) 06/06/2022 0000   PLT 317 06/06/2022 0000   MCV 78.0 07/15/2012 1058   MCH 26.3 07/15/2012 1058   MCHC 33.7 07/15/2012 1058   RDW 15.5 07/15/2012 1058   Iron Studies No results found for: "IRON", "TIBC", "FERRITIN", "IRONPCTSAT" Lipid Panel     Component Value Date/Time   CHOL 134 07/12/2022 1055   TRIG 381 (H) 07/12/2022 1055   HDL 43 07/12/2022 1055   CHOLHDL 4.7 Ratio 03/28/2007 2157   VLDL NOT CALC mg/dL 27/25/3664 4034   LDLCALC 36 07/12/2022 1055   Hepatic Function Panel     Component Value Date/Time   PROT 6.9 11/20/2022 0833   ALBUMIN 4.5 11/20/2022 0833   AST 23 11/20/2022 0833   ALT 26 11/20/2022 0833   ALKPHOS 93 11/20/2022 0833   BILITOT 0.3 11/20/2022 0833      Component Value Date/Time   TSH 3.110 07/12/2022 1055   Nutritional Lab Results  Component Value Date   VD25OH 56.7 03/28/2023   VD25OH 32.7 11/20/2022   VD25OH 18.0 (L) 07/12/2022    ASSOCIATED CONDITIONS ADDRESSED TODAY  ASSESSMENT AND  PLAN  Problem List Items Addressed This Visit     Hypertension associated with diabetes ([name])   Vitamin D deficiency   Relevant Medications   Vitamin D, Ergocalciferol, (DRISDOL) 1.25 MG (50000 UNIT) CAPS capsule   Obesity ([name])- Starting BMI 45.06/DATE 07/12/22   Chronic idiopathic constipation   Diabetes mellitus ([name]) - Primary   Hematuria   BMI 39.0-39.9,adult Current BMI 39.6     Type 2 Diabetes Mellitus with other specified complication, with long-term current use of insulin HgbA1c is not at goal. Last A1c was 6.7- down from 6.9 recently, but has been lower.  CBGs: CGM- notes Fasting levels still 130-150 and post prandial to 180's Episodes of hypoglycemia: no Medications: Mounjaro 12.5 mg once a wk & Tresiba 20 units in the morning & 20 units at night. Pt has concerns that the Evaristo Bury is not working well for her and has follow up  with Endocrinology scheduled.  -She has been on several other medications for diabetes and did not tolerate them including: Invokana (yeast infections), Afrezza (chronic cough), Victoza (ineffective), Metformin (GI issues).   Lab Results  Component Value Date   HGBA1C 6.0 (A) 01/16/2023   HGBA1C 5.5 10/10/2022   HGBA1C 8.6 06/06/2022   Lab Results  Component Value Date   LDLCALC 36 07/12/2022   CREATININE 0.65 11/20/2022   No results found for: "GFR"  Plan: Continue Mounjaro 12.5 mg SQ weekly and Tresiba 20 units in the morning & 20 units at night and SSI Novolog as per Endocrinology.  She has follow up scheduled with Endocrinology.  She is working  on nutrition plan to decrease simple carbohydrates, increase lean proteins and exercise to promote weight loss and improve glycemic control .   Hypertension She saw Dr. Rennis Golden 03/29/23: From notes: 03/29/2023  Loretta Luna is seen today in follow-up of dyslipidemia.  Overall cholesterol continues to be well treated.  LDL particle #586, LDL 40, HDL 43 and triglycerides 233.  Triglycerides have been  fairly variable.  Overall her treatment is very good.  She continues to have issues with low blood pressure.  I previously stopped her chlorthalidone.  She is lost about 30 pounds through the [facility] weight loss program.  I suspect this is why worsening lower blood pressures.  She has had some fatigue.  She does have HCTZ on her list to use as needed. 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   Loretta Luna has well treated dyslipidemia.  I would not recommend any changes to her regimen.  With regards to low blood pressure, I would advise decreasing her olmesartan from 40 to 20 mg daily.  Will give her a new prescription but currently she could cut her tablets in half.  She should monitor blood pressure and use her HCTZ sparingly.  She notes that when she takes that she has some back pain but she is also had some hematuria and has a follow-up with Dr. Retta Diones.  I suspect she has a kidney stone.  Follow-up with me in 6 months or sooner as necessary.  Chrystie Nose, MD, [facility], FACP    Today at [facility]-_ Hypertension well controlled, asymptomatic, and no significant medication side effects noted.  Medication(s): olmesartan 20 mg daily    Rarely taking hydrochlorothiazide as advised    Urology work up ongoing for hematuria, possible kidney stone.  BP Readings from Last 3 Encounters:  04/18/23 (!) 103/59  04/02/23 133/78  03/29/23 (!) 106/54   Lab Results  Component Value Date   CREATININE 0.65 11/20/2022   CREATININE 0.7 06/06/2022   CREATININE 0.77 07/15/2012   No results found for: "GFR"  Plan: Continue medications at current dosages. Monitor for hypotension and advised to keep a log of BP at home and take at different times during the day.  Continue to work on nutrition plan to promote  weight loss and improve BP control.  Continue usual follow up with cardiology/PCP.    Vitamin D Deficiency Vitamin D is at goal of 50.  Most recent vitamin D level was 56.7. She is on  prescription ergocalciferol 50,000 IU weekly. Reports no N/V or muscle weakness or other side effects with Ergocalciferol.  Lab Results  Component Value Date   VD25OH 56.7 03/28/2023  VD25OH 32.7 11/20/2022   VD25OH 18.0 (L) 07/12/2022    Plan: Continue and refill  prescription ergocalciferol 50,000 IU weekly Low vitamin D levels can be associated with adiposity and may result in leptin resistance and weight gain. Also associated with fatigue. Currently on vitamin D supplementation without any adverse effects.  Recheck vitamin D level several times yearly to optimize supplementation/avoid over supplementation.   Constipation Loretta Luna notes constipation.   This is likely related to GLP-1.  She has been taking Powder magnesium supplements with good result. Constipation is moderately controlled.  Plan: Continue magnesium supplement.  Increase fiber and water. Add MiraLAX once daily.  May take twice a day or every other day based on results. Monitor closely on GLP medications.    Hematuria/ History of kidney stones Pending CT scan, negative cystoscopy per Dr. Retta Diones. -Follow up on scheduling of CT scan and provided patient [facility] imaging scheduling phone number today. Will follow with urology.    ATTESTASTION STATEMENTS:  Reviewed by clinician on day of visit: allergies, medications, problem list, medical history, surgical history, family history, social history, and previous encounter notes.   I have personally spent 49 minutes total time today in preparation, patient care, nutritional counseling and documentation for this visit, including the following: review of clinical lab tests; review of medical tests/procedures/services.      Mishaal Lansdale, PA-C

## 2023-04-26 ENCOUNTER — Encounter: Payer: Self-pay | Admitting: Urology

## 2023-05-04 ENCOUNTER — Other Ambulatory Visit: Payer: Self-pay | Admitting: Nurse Practitioner

## 2023-05-07 ENCOUNTER — Ambulatory Visit (INDEPENDENT_AMBULATORY_CARE_PROVIDER_SITE_OTHER): Payer: BC Managed Care – PPO | Admitting: Physician Assistant

## 2023-05-09 ENCOUNTER — Ambulatory Visit
Admission: RE | Admit: 2023-05-09 | Discharge: 2023-05-09 | Disposition: A | Discharge status: Home / Self Care | Payer: BC Managed Care – PPO | Source: Ambulatory Visit | Attending: Urology | Admitting: Urology

## 2023-05-09 DIAGNOSIS — D259 Leiomyoma of uterus, unspecified: Secondary | ICD-10-CM | POA: Diagnosis not present

## 2023-05-09 DIAGNOSIS — N200 Calculus of kidney: Secondary | ICD-10-CM | POA: Diagnosis not present

## 2023-05-09 DIAGNOSIS — Z91041 Radiographic dye allergy status: Secondary | ICD-10-CM

## 2023-05-09 DIAGNOSIS — R310 Gross hematuria: Secondary | ICD-10-CM

## 2023-05-09 DIAGNOSIS — R3129 Other microscopic hematuria: Secondary | ICD-10-CM

## 2023-05-09 DIAGNOSIS — Z87442 Personal history of urinary calculi: Secondary | ICD-10-CM

## 2023-05-09 MED ORDER — IOPAMIDOL (ISOVUE-300) INJECTION 61%
200.0000 mL | Freq: Once | INTRAVENOUS | Status: AC | PRN
Start: 2023-05-09 — End: 2023-05-09
  Administered 2023-05-09: 100 mL via INTRAVENOUS

## 2023-05-21 ENCOUNTER — Encounter: Payer: Self-pay | Admitting: Nurse Practitioner

## 2023-05-21 ENCOUNTER — Ambulatory Visit: Payer: BC Managed Care – PPO | Admitting: Nurse Practitioner

## 2023-05-21 VITALS — BP 114/66 | HR 77 | Ht 63.0 in | Wt 228.8 lb

## 2023-05-21 DIAGNOSIS — E119 Type 2 diabetes mellitus without complications: Secondary | ICD-10-CM

## 2023-05-21 DIAGNOSIS — Z7985 Long-term (current) use of injectable non-insulin antidiabetic drugs: Secondary | ICD-10-CM

## 2023-05-21 DIAGNOSIS — Z794 Long term (current) use of insulin: Secondary | ICD-10-CM | POA: Diagnosis not present

## 2023-05-21 LAB — POCT GLYCOSYLATED HEMOGLOBIN (HGB A1C): Hemoglobin A1C: 6.6 % — AB (ref 4.0–5.6)

## 2023-05-21 MED ORDER — TIRZEPATIDE 15 MG/0.5ML ~~LOC~~ SOAJ: 15.0000 mg | SUBCUTANEOUS | 1 refills | Status: DC

## 2023-05-21 MED ORDER — PEN NEEDLES 31G X 8 MM MISC: 6 refills | Status: AC

## 2023-05-21 MED ORDER — TRESIBA FLEXTOUCH 200 UNIT/ML ~~LOC~~ SOPN: 30.0000 [IU] | PEN_INJECTOR | Freq: Every evening | SUBCUTANEOUS | 3 refills | Status: DC

## 2023-05-21 MED ORDER — DEXCOM G7 SENSOR MISC: 3 refills | Status: DC

## 2023-05-21 NOTE — Progress Notes (Signed)
Endocrinology Follow Up Note       05/21/2023, 9:02 AM   Subjective:    Patient ID: Loretta Luna, female    DOB: 12/27/1970.  Alna Eggert is being seen in consultation for management of currently uncontrolled symptomatic diabetes requested by  Benita Stabile, MD.  She also sees healthy weight and wellness in [place].   Past Medical History:  Diagnosis Date   Anxiety    Asthma    Back pain    Chest pain    Constipation    Depression    Diabetes ([name])    Essential hypertension    Fatty liver    GERD (gastroesophageal reflux disease)    History of swelling of feet    Hypercalcemia    Hyperglycemia    Hypokalemia    IBS (irritable bowel syndrome)    Infertility, female    Insomnia    Joint pain    Mixed hyperlipidemia    Obesity    Pancreatic cyst    Polycystic ovarian syndrome    Proteinuria    Systolic murmur    Type 2 diabetes mellitus ([name])     Past Surgical History:  Procedure Laterality Date   CHOLECYSTECTOMY      Social History   Socioeconomic History   Marital status: Single    Spouse name: Not on file   Number of children: Not on file   Years of education: Not on file   Highest education level: Not on file  Occupational History   Not on file  Tobacco Use   Smoking status: Never   Smokeless tobacco: Never  Substance and Sexual Activity   Alcohol use: No    Alcohol/week: 0.0 standard drinks of alcohol   Drug use: Never   Sexual activity: Not Currently  Other Topics Concern   Not on file  Social History Narrative   Not on file   Social Determinants of Health   Financial Resource Strain: Not on file  Food Insecurity: Not on file  Transportation Needs: Not on file  Physical Activity: Insufficiently Active (05/05/2020)   [facility]   Exercise Vital Sign    Days of Exercise per Week: 3 days    Minutes of Exercise per Session: 30 min  Stress:  Not on file  Social Connections: Not on file    Family History  Problem Relation Age of Onset   COPD Mother    Hyperlipidemia Mother    Hypertension Mother    Stroke Mother    Diabetes Mother    Heart disease Mother    Obesity Mother    Hyperlipidemia Father    Hypertension Father    Stroke Father    Diabetes Father    Sleep apnea Father    Obesity Father    Hypertension Sister    Asthma Brother    Hyperlipidemia Brother    Hypertension Brother    Cancer Maternal Grandmother    COPD Maternal Grandfather    Hyperlipidemia Maternal Grandfather    Cancer Paternal Grandmother    Stroke Paternal Grandfather     Outpatient Encounter Medications as of 05/21/2023  Medication Sig   Bempedoic  Acid-Ezetimibe (NEXLIZET) 180-10 MG TABS Take by mouth.   ciclopirox (PENLAC) 8 % solution Apply topically.   cyanocobalamin (VITAMIN B12) 500 MCG tablet Take 1 tablet (500 mcg total) by mouth daily. (Patient taking differently: Take 1,000 mcg by mouth daily.)   HYDROCHLOROTHIAZIDE PO Take by mouth.   icosapent Ethyl (VASCEPA) 1 g capsule Take 2 g by mouth 2 (two) times daily.   Insulin Pen Needle (PEN NEEDLES) 31G X 8 MM MISC Use to inject insulin once daily   loratadine (CLARITIN) 10 MG tablet Take 10 mg by mouth daily.   Magnesium 250 MG TABS May use CALM supplement- 325mg  mag 1-twice daily   olmesartan (BENICAR) 20 MG tablet Take 1 tablet (20 mg total) by mouth daily.   potassium chloride (KLOR-CON) 10 MEQ tablet Take 10 mEq by mouth 2 (two) times daily.   rosuvastatin (CRESTOR) 20 MG tablet Take 20 mg by mouth daily.   tirzepatide (MOUNJARO) 15 MG/0.5ML Pen Inject 15 mg into the skin once a week.   Vitamin D, Ergocalciferol, (DRISDOL) 1.25 MG (50000 UNIT) CAPS capsule Take 1 capsule (50,000 Units total) by mouth every 7 (seven) days.   [DISCONTINUED] Continuous Blood Gluc Sensor (DEXCOM G7 SENSOR) MISC Change sensor every 10 days as directed.   [DISCONTINUED] Insulin Pen Needle (TECHLITE  PLUS PEN NEEDLES) 32G X 4 MM MISC USE UP TO 4 TIMES DAILY AS DIRECTED.   [DISCONTINUED] tirzepatide (MOUNJARO) 12.5 MG/0.5ML Pen Inject 12.5 mg into the skin once a week.   [DISCONTINUED] TRESIBA FLEXTOUCH 200 UNIT/ML FlexTouch Pen Inject 30 Units into the skin at bedtime. (Patient taking differently: Inject 30 Units into the skin at bedtime. 20 in morning, 20 at night)   Continuous Glucose Sensor (DEXCOM G7 SENSOR) MISC Change sensor every 10 days as directed.   TRESIBA FLEXTOUCH 200 UNIT/ML FlexTouch Pen Inject 30 Units into the skin at bedtime.   [DISCONTINUED] diazepam (VALIUM) 2 MG tablet Take 1 tablet 30 minutes prior to CT study. (Patient not taking: Reported on 05/21/2023)   [DISCONTINUED] diphenhydrAMINE (BENADRYL) 50 MG capsule Dispense #1. Take 1 capsule by mouth 1 hour before exam. (Patient not taking: Reported on 05/21/2023)   [DISCONTINUED] predniSONE (DELTASONE) 50 MG tablet Dispense #3. Take 1 tablet by mouth 13 hours prior to exam, take 1 tab by mouth 7 hours prior to exam, then take 1 tablet by mouth 1 hour prior to exam. (Patient not taking: Reported on 05/21/2023)   No facility-administered encounter medications on file as of 05/21/2023.    ALLERGIES: Allergies  Allergen Reactions   Iodinated Contrast Media Hives and Swelling    Ok with 13 hour premed   Food     shellfish   Iodine     REACTION: swelling, hives, tachypnea   Metformin And Related Diarrhea    VACCINATION STATUS: Immunization History  Administered Date(s) Administered   Influenza Whole 04/10/2007    Diabetes She presents for her follow-up diabetic visit. She has type 2 diabetes mellitus. Onset time: diagnosed at approx age of 52. Her disease course has been stable. There are no hypoglycemic associated symptoms. There are no diabetic associated symptoms. There are no diabetic complications. Risk factors for coronary artery disease include diabetes mellitus, dyslipidemia, family history, obesity,  hypertension and sedentary lifestyle. Current diabetic treatment includes insulin injections (and Mounjaro). She is compliant with treatment most of the time (has had trouble getting Mounjaro intermittently due to supply issues). Her weight is fluctuating minimally. She is following a generally healthy diet. Meal  planning includes ADA exchanges, avoidance of concentrated sweets, carbohydrate counting and calorie counting. She has had a previous visit with a dietitian. She rarely participates in exercise. Her home blood glucose trend is increasing steadily. Her overall blood glucose range is 140-180 mg/dl. (She presents today with her CGM, no logs, showing at target glycemic profile, creeping up slightly.  Her POCT A1c today is 6.6%, increasing from last visit of 6%.  She denies any significant hypoglycemia.  Analysis of her CGM shows TIR 87%, TAR 13%, TBR 0%.  She notes she started splitting her Tresiba dose and taking 20 units in the morning and 20 units at night to see if it helped bring glucose down.  So far, it has not.) An ACE inhibitor/angiotensin II receptor blocker is being taken. She sees a podiatrist.Eye exam is current.   Review of systems  Constitutional: + increasing body weight,  current Body mass index is 40.53 kg/m. , no fatigue, no subjective hyperthermia, no subjective hypothermia Eyes: no blurry vision, no xerophthalmia ENT: no sore throat, no nodules palpated in throat, no dysphagia/odynophagia, no hoarseness Cardiovascular: no chest pain, no shortness of breath, no palpitations, no leg swelling Respiratory: no cough, no shortness of breath Gastrointestinal: no nausea/vomiting/diarrhea Musculoskeletal: no muscle/joint aches Skin: no rashes, no hyperemia Neurological: no tremors, no numbness, no tingling, no dizziness Psychiatric: no depression, no anxiety  Objective:     BP 114/66 (BP Location: Left Arm, Patient Position: Sitting, Cuff Size: Large)   Pulse 77   Ht 5\' 3"  (1.6  m)   Wt 228 lb 12.8 oz (103.8 kg)   BMI 40.53 kg/m   Wt Readings from Last 3 Encounters:  05/21/23 228 lb 12.8 oz (103.8 kg)  04/18/23 223 lb (101.2 kg)  03/29/23 226 lb (102.5 kg)     BP Readings from Last 3 Encounters:  05/21/23 114/66  04/18/23 (!) 103/59  04/02/23 133/78      Physical Exam- Limited  Constitutional:  Body mass index is 40.53 kg/m. , not in acute distress, normal state of mind Eyes:  EOMI, no exophthalmos Musculoskeletal: no gross deformities, strength intact in all four extremities, no gross restriction of joint movements Skin:  no rashes, no hyperemia Neurological: no tremor with outstretched hands   Diabetic Foot Exam - Simple   No data filed    CMP ( most recent) CMP     Component Value Date/Time   NA 142 11/20/2022 0833   K 4.4 11/20/2022 0833   CL 102 11/20/2022 0833   CO2 23 11/20/2022 0833   GLUCOSE 118 (H) 11/20/2022 0833   GLUCOSE 120 (H) 07/15/2012 1058   BUN 16 11/20/2022 0833   CREATININE 0.65 11/20/2022 0833   CREATININE 0.77 07/15/2012 1058   CALCIUM 10.5 (H) 11/20/2022 0833   PROT 6.9 11/20/2022 0833   ALBUMIN 4.5 11/20/2022 0833   AST 23 11/20/2022 0833   ALT 26 11/20/2022 0833   ALKPHOS 93 11/20/2022 0833   BILITOT 0.3 11/20/2022 0833     Diabetic Labs (most recent): Lab Results  Component Value Date   HGBA1C 6.6 (A) 05/21/2023   HGBA1C 6.0 (A) 01/16/2023   HGBA1C 5.5 10/10/2022     Lipid Panel ( most recent) Lipid Panel     Component Value Date/Time   CHOL 134 07/12/2022 1055   TRIG 381 (H) 07/12/2022 1055   HDL 43 07/12/2022 1055   CHOLHDL 4.7 Ratio 03/28/2007 2157   VLDL NOT CALC mg/dL 19/50/9326 7124   LDLCALC 36 07/12/2022 1055  LABVLDL 55 (H) 07/12/2022 1055      Lab Results  Component Value Date   TSH 3.110 07/12/2022   FREET4 1.10 07/12/2022           Assessment & Plan:   1) Type 2 diabetes mellitus without complication, with long-term current use of insulin  She presents today with  her CGM, no logs, showing at target glycemic profile, creeping up slightly.  Her POCT A1c today is 6.6%, increasing from last visit of 6%.  She denies any significant hypoglycemia.  Analysis of her CGM shows TIR 87%, TAR 13%, TBR 0%.  She notes she started splitting her Tresiba dose and taking 20 units in the morning and 20 units at night to see if it helped bring glucose down.  So far, it has not.  - Marsie Kraynak has currently uncontrolled symptomatic type 2 DM since 52 years of age.   -Recent labs reviewed.  - I had a long discussion with her about the progressive nature of diabetes and the pathology behind its complications. -her diabetes is not currently complicated but she remains at a high risk for more acute and chronic complications which include CAD, CVA, CKD, retinopathy, and neuropathy. These are all discussed in detail with her.  The following Lifestyle Medicine recommendations according to American College of Lifestyle Medicine [facility]) were discussed and offered to patient and she agrees to start the journey:  A. Whole Foods, Plant-based plate comprising of fruits and vegetables, plant-based proteins, whole-grain carbohydrates was discussed in detail with the patient.   A list for source of those nutrients were also provided to the patient.  Patient will use only water or unsweetened tea for hydration. B.  The need to stay away from risky substances including alcohol, smoking; obtaining 7 to 9 hours of restorative sleep, at least 150 minutes of moderate intensity exercise weekly, the importance of healthy social connections,  and stress reduction techniques were discussed. C.  A full color page of  Calorie density of various food groups per pound showing examples of each food groups was provided to the patient.  - Nutritional counseling repeated at each appointment due to patients tendency to fall back in to old habits.  - The patient admits there is a room for improvement in their diet  and drink choices. -  Suggestion is made for the patient to avoid simple carbohydrates from their diet including Cakes, Sweet Desserts / Pastries, Ice Cream, Soda (diet and regular), Sweet Tea, Candies, Chips, Cookies, Sweet Pastries, Store Bought Juices, Alcohol in Excess of 1-2 drinks a day, Artificial Sweeteners, Coffee Creamer, and "Sugar-free" Products. This will help patient to have stable blood glucose profile and potentially avoid unintended weight gain.   - I encouraged the patient to switch to unprocessed or minimally processed complex starch and increased protein intake (animal or plant source), fruits, and vegetables.   - Patient is advised to stick to a routine mealtimes to eat 3 meals a day and avoid unnecessary snacks (to snack only to correct hypoglycemia).  - I have approached her with the following individualized plan to manage her diabetes and patient agrees:   - She is advised to adjust her Tresiba to 30 units SQ nightly (instead of splitting doses).  Will increase her Mounjaro to 15 mg SQ weekly.  I did change the depth of her insulin needles to 8 mm.   The 4 mm may not have gone deep enough to work properly (she does report insulin would seep  back out).  -she is encouraged to continue monitoring glucose 4 times daily (using her CGM), before meals and before bed, and to call the clinic if she has readings less than 70 or above 200 for 3 tests in a row.  - she is warned not to take insulin without proper monitoring per orders. - Adjustment parameters are given to her for hypo and hyperglycemia in writing.  -She has been on several other medications for diabetes and did not tolerate them including: Invokana (yeast infections), Afrezza (chronic cough), Victoza (ineffective), Metformin (GI issues).  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  I do not feel this is impacting her diabetes from the available chart  notes.  - Specific targets for  A1c; LDL, HDL, and Triglycerides were discussed with the patient.  2) Blood Pressure /Hypertension:  her blood pressure is controlled to target.   she is advised to continue her current medications including Benicar 40 mg p.o. daily with breakfast and Lasix 20 mg po daily.  3) Lipids/Hyperlipidemia:    Review of her recent lipid panel from 03/21/23 showed controlled LDL at 36 and elevated triglycerides of 233 (worsening slightly) .  she is advised to continue Nexlizet 180-10 mg mg daily at bedtime and Crestor 20 mg po daily, and Vascepa 2 grams twice daily.  Side effects and precautions discussed with her.  4)  Weight/Diet:  her Body mass index is 40.53 kg/m.  -  clearly complicating her diabetes care.   she is a candidate for weight loss. I discussed with her the fact that loss of 5 - 10% of her  current body weight will have the most impact on her diabetes management.  Exercise, and detailed carbohydrates information provided  -  detailed on discharge instructions.  5) Chronic Care/Health Maintenance: -she is on ACEI/ARB and Statin medications and is encouraged to initiate and continue to follow up with Ophthalmology, Dentist, Podiatrist at least yearly or according to recommendations, and advised to stay away from smoking. I have recommended yearly flu vaccine and pneumonia vaccine at least every 5 years; moderate intensity exercise for up to 150 minutes weekly; and sleep for at least 7 hours a day.  - she is advised to maintain close follow up with Benita Stabile, MD for primary care needs, as well as her other providers for optimal and coordinated care.     I spent  46  minutes in the care of the patient today including review of labs from CMP, Lipids, Thyroid Function, Hematology (current and previous including abstractions from other facilities); face-to-face time discussing  her blood glucose readings/logs, discussing hypoglycemia and hyperglycemia episodes  and symptoms, medications doses, her options of short and long term treatment based on the latest standards of care / guidelines;  discussion about incorporating lifestyle medicine;  and documenting the encounter. Risk reduction counseling performed per USPSTF guidelines to reduce obesity and cardiovascular risk factors.     Please refer to Patient Instructions for Blood Glucose Monitoring and Insulin/Medications Dosing Guide"  in media tab for additional information. Please  also refer to " Patient Self Inventory" in the Media  tab for reviewed elements of pertinent patient history.  Alden Server participated in the discussions, expressed understanding, and voiced agreement with the above plans.  All questions were answered to her satisfaction. she is encouraged to contact clinic should she have any questions or concerns prior to her return visit.  Follow up plan: - Return in about 4 months (around 09/18/2023) for Diabetes F/U with A1c in office, No previsit labs, Bring meter and logs.   Ronny Bacon, [employer] [facility] [address] [place], [place] [address] Phone: [phone] Fax: [phone]  05/21/2023, 9:02 AM

## 2023-05-27 ENCOUNTER — Encounter (INDEPENDENT_AMBULATORY_CARE_PROVIDER_SITE_OTHER): Payer: BC Managed Care – PPO | Admitting: Physician Assistant

## 2023-05-27 DIAGNOSIS — E1159 Type 2 diabetes mellitus with other circulatory complications: Secondary | ICD-10-CM

## 2023-05-27 DIAGNOSIS — E538 Deficiency of other specified B group vitamins: Secondary | ICD-10-CM

## 2023-05-27 DIAGNOSIS — Z794 Long term (current) use of insulin: Secondary | ICD-10-CM

## 2023-05-27 DIAGNOSIS — E1169 Type 2 diabetes mellitus with other specified complication: Secondary | ICD-10-CM

## 2023-05-27 DIAGNOSIS — E559 Vitamin D deficiency, unspecified: Secondary | ICD-10-CM

## 2023-05-27 NOTE — Progress Notes (Signed)
Rescheduled visit.

## 2023-06-12 ENCOUNTER — Ambulatory Visit (INDEPENDENT_AMBULATORY_CARE_PROVIDER_SITE_OTHER): Payer: BC Managed Care – PPO | Admitting: Family Medicine

## 2023-06-12 VITALS — BP 114/73 | HR 80 | Temp 98.2°F | Ht 63.0 in | Wt 224.0 lb

## 2023-06-12 DIAGNOSIS — G479 Sleep disorder, unspecified: Secondary | ICD-10-CM | POA: Diagnosis not present

## 2023-06-12 DIAGNOSIS — Z7985 Long-term (current) use of injectable non-insulin antidiabetic drugs: Secondary | ICD-10-CM

## 2023-06-12 DIAGNOSIS — E785 Hyperlipidemia, unspecified: Secondary | ICD-10-CM

## 2023-06-12 DIAGNOSIS — Z794 Long term (current) use of insulin: Secondary | ICD-10-CM | POA: Diagnosis not present

## 2023-06-12 DIAGNOSIS — E559 Vitamin D deficiency, unspecified: Secondary | ICD-10-CM | POA: Diagnosis not present

## 2023-06-12 DIAGNOSIS — E1169 Type 2 diabetes mellitus with other specified complication: Secondary | ICD-10-CM | POA: Diagnosis not present

## 2023-06-12 MED ORDER — VITAMIN D (ERGOCALCIFEROL) 1.25 MG (50000 UNIT) PO CAPS: 50000.0000 [IU] | ORAL_CAPSULE | ORAL | 0 refills | Status: DC

## 2023-06-12 NOTE — Progress Notes (Signed)
SUBJECTIVE:  Chief Complaint: Obesity  Interim History: Patient has had a great time so far here at the clinic.  She normally sees Dr. Val Eagle and Ines Bloomer.  She is on Category 3 and has been limited in compliance to the plan due to over time and eating out more and celebrating her birthday.  For the next month work is going to be more controlled and on schedule. She mentions that she would like to get back on plan. No sure what changes need to be made in order to more easily follow meal plan.  No plans for travel or activities for the month.   Loretta Luna is here to discuss her progress with her obesity treatment plan. She is on the Category 3 Plan and states she is following her eating plan approximately 25 % of the time. She states she is not exercising.   OBJECTIVE: Visit Diagnoses: Problem List Items Addressed This Visit       Endocrine   Hyperlipidemia associated with type 2 diabetes mellitus ([name])   Patient on statin daily.  Last LDL at goal of less than 70.  FLP ordered today.       Relevant Orders   Lipid Panel With LDL/HDL Ratio (Completed)   Diabetes mellitus ([name])   Patient on tresiba, mounjaro, statin and ARB.  She is still occasionally overindulging in carb intake.  Needs labs today.  A1c, CMP and C- peptide ordered today.  Will discuss labs at next appointment. No change in medications.      Relevant Orders   Comprehensive metabolic panel (Completed)   Hemoglobin A1c (Completed)   C-peptide     Other   Vitamin D deficiency - Primary   Vitamin D level of 56 in September. She is on weekly supplementation but was close to goal.  She will switch to prescription strength Vit D every 2 weeks to ensure she is not over replaced.  Refill Vitamin D to take every 2 weeks.      Relevant Medications   Vitamin D, Ergocalciferol, (DRISDOL) 1.25 MG (50000 UNIT) CAPS capsule   Other Relevant Orders   VITAMIN D 25 Hydroxy (Vit-D Deficiency, Fractures) (Completed)   Disordered sleep    Patient experiencing changes in sleep- harder time falling asleep, staying asleep and poor quantity of sleep.  Experiencing some restless leg type symptoms- magnesium level ordered today.      Relevant Orders   Magnesium (Completed)    No data recorded  No data recorded  No data recorded  No data recorded    ASSESSMENT AND PLAN:  Diet: Loretta Luna is currently in the action stage of change. As such, her goal is to continue with weight loss efforts. She has agreed to Category 3 Plan.  Exercise: Loretta Luna has been instructed that some exercise is better than none for weight loss and overall health benefits.   Behavior Modification:  We discussed the following Behavioral Modification Strategies today: increasing lean protein intake, increasing vegetables, decreasing eating out, meal planning and cooking strategies, and better snacking choices. Make a plan for exercise to implement in January 2025 that includes resistance training to focus on muscle maintenance.  No follow-ups on file.Marland Kitchen She was informed of the importance of frequent follow up visits to maximize her success with intensive lifestyle modifications for her multiple health conditions.  Attestation Statements:   Reviewed by clinician on day of visit: allergies, medications, problem list, medical history, surgical history, family history, social history, and previous encounter notes.  Reuben Likes, MD

## 2023-06-12 NOTE — Assessment & Plan Note (Signed)
Vitamin D level of 56 in September. She is on weekly supplementation but was close to goal.  She will switch to prescription strength Vit D every 2 weeks to ensure she is not over replaced.  Refill Vitamin D to take every 2 weeks.

## 2023-06-13 LAB — VITAMIN D 25 HYDROXY (VIT D DEFICIENCY, FRACTURES): Vit D, 25-Hydroxy: 42.5 ng/mL (ref 30.0–100.0)

## 2023-06-13 LAB — LIPID PANEL WITH LDL/HDL RATIO
Cholesterol, Total: 129 mg/dL (ref 100–199)
HDL: 43 mg/dL
LDL Chol Calc (NIH): 45 mg/dL (ref 0–99)
LDL/HDL Ratio: 1 ratio (ref 0.0–3.2)
Triglycerides: 263 mg/dL — ABNORMAL HIGH (ref 0–149)
VLDL Cholesterol Cal: 41 mg/dL — ABNORMAL HIGH (ref 5–40)

## 2023-06-13 LAB — COMPREHENSIVE METABOLIC PANEL WITH GFR
ALT: 24 IU/L (ref 0–32)
AST: 21 IU/L (ref 0–40)
Albumin: 4.5 g/dL (ref 3.8–4.9)
Alkaline Phosphatase: 89 IU/L (ref 44–121)
BUN/Creatinine Ratio: 27 — ABNORMAL HIGH (ref 9–23)
BUN: 19 mg/dL (ref 6–24)
Bilirubin Total: 0.4 mg/dL (ref 0.0–1.2)
CO2: 24 mmol/L (ref 20–29)
Calcium: 10.3 mg/dL — ABNORMAL HIGH (ref 8.7–10.2)
Chloride: 105 mmol/L (ref 96–106)
Creatinine, Ser: 0.7 mg/dL (ref 0.57–1.00)
Globulin, Total: 2.3 g/dL (ref 1.5–4.5)
Glucose: 108 mg/dL — ABNORMAL HIGH (ref 70–99)
Potassium: 4.5 mmol/L (ref 3.5–5.2)
Sodium: 143 mmol/L (ref 134–144)
Total Protein: 6.8 g/dL (ref 6.0–8.5)
eGFR: 103 mL/min/1.73

## 2023-06-13 LAB — MAGNESIUM: Magnesium: 2.1 mg/dL (ref 1.6–2.3)

## 2023-06-13 LAB — HEMOGLOBIN A1C
Est. average glucose Bld gHb Est-mCnc: 154 mg/dL
Hgb A1c MFr Bld: 7 % — ABNORMAL HIGH (ref 4.8–5.6)

## 2023-06-19 DIAGNOSIS — E876 Hypokalemia: Secondary | ICD-10-CM | POA: Diagnosis not present

## 2023-06-19 DIAGNOSIS — E1169 Type 2 diabetes mellitus with other specified complication: Secondary | ICD-10-CM | POA: Diagnosis not present

## 2023-06-19 DIAGNOSIS — Z23 Encounter for immunization: Secondary | ICD-10-CM | POA: Diagnosis not present

## 2023-06-19 DIAGNOSIS — I10 Essential (primary) hypertension: Secondary | ICD-10-CM | POA: Diagnosis not present

## 2023-06-19 DIAGNOSIS — E1165 Type 2 diabetes mellitus with hyperglycemia: Secondary | ICD-10-CM | POA: Diagnosis not present

## 2023-06-19 DIAGNOSIS — E7849 Other hyperlipidemia: Secondary | ICD-10-CM | POA: Diagnosis not present

## 2023-06-22 DIAGNOSIS — G479 Sleep disorder, unspecified: Secondary | ICD-10-CM | POA: Insufficient documentation

## 2023-06-22 NOTE — Assessment & Plan Note (Signed)
Patient experiencing changes in sleep- harder time falling asleep, staying asleep and poor quantity of sleep.  Experiencing some restless leg type symptoms- magnesium level ordered today.

## 2023-06-22 NOTE — Assessment & Plan Note (Signed)
Patient on tresiba, Greggory Keen, statin and ARB.  She is still occasionally overindulging in carb intake.  Needs labs today.  A1c, CMP and C- peptide ordered today.  Will discuss labs at next appointment. No change in medications.

## 2023-06-22 NOTE — Assessment & Plan Note (Signed)
Patient on statin daily.  Last LDL at goal of less than 70.  FLP ordered today.

## 2023-07-09 NOTE — Progress Notes (Signed)
 SUBJECTIVE: Discussed the use of AI scribe software for clinical note transcription with the patient, who gave verbal consent to proceed.  Chief Complaint: Obesity  Interim History: She is down 2 lbs since her last visit.  Down 32 lbs overall TBW loss 12.6% Loretta Luna is a 53 year old female with a history of type 2 diabetes, vitamin D  deficiency, hyperlipidemia, and hypertension, who presents for a follow-up visit regarding her obesity treatment plan. She has been on Mounjaro  15mg  weekly, which was increased by endocrinology, and Tresiba  insulin . She also uses a continuous glucose monitor.  The patient reports that her blood sugars have improved over the past month since the increase in Mounjaro  dosage. She expresses concerns about her endocrinologist's goal to eventually wean her off insulin , fearing she may not be able to maintain her blood sugar levels without it. However, she acknowledges the potential benefits of weight loss if she could stop insulin .  The patient admits to struggling with food choices and exercise, and reports difficulty losing weight and poor sleep. She has been trying to maintain a healthy diet, but admits to occasional indulgences, especially during the holiday season. Despite these challenges, she has managed to lose 32 pounds.  The patient also reports work-related stress due to changes in her workplace, including the departure of a receptionist and increased interactions with the finance team. She is trying to manage this stress by delegating tasks and seeking help when needed.  Regarding her exercise routine, the patient reports walking and doing sit-ups an average of three days a week. She expresses a desire to return to the gym but has safety concerns due to it being dark when she finishes work. She has been doing some exercises at home with 5-pound weights, but reports pain in her brachioradialis muscle when doing certain exercises.  The patient's blood  pressure has been well-controlled, and her olmesartan  dosage was recently halved. She takes hydrochlorothiazide and Lasix as needed, with infrequent use. She also takes over-the-counter vitamin B12. Her vitamin D  dosage was recently increased to weekly due to a decrease in her levels.  Loretta Luna is here to discuss her progress with her obesity treatment plan. She is on the Category 3 Plan and states she is following her eating plan approximately 50 % of the time. She states she is exercising walking/sit ups 25 minutes 3-4 times per week.   OBJECTIVE: Visit Diagnoses: Problem List Items Addressed This Visit     Hyperlipidemia associated with type 2 diabetes mellitus ([name])   Relevant Medications   furosemide (LASIX) 20 MG tablet   tirzepatide  (MOUNJARO ) 15 MG/0.5ML Pen   Hypertension associated with diabetes ([name])   Relevant Medications   furosemide (LASIX) 20 MG tablet   tirzepatide  (MOUNJARO ) 15 MG/0.5ML Pen   Vitamin D  deficiency   Relevant Medications   Vitamin D , Ergocalciferol , (DRISDOL ) 1.25 MG (50000 UNIT) CAPS capsule   Obesity ([name])- Starting BMI 45.06/DATE 07/12/22   Relevant Medications   tirzepatide  (MOUNJARO ) 15 MG/0.5ML Pen   Chronic idiopathic constipation   Diabetes mellitus ([name]) - Primary   Relevant Medications   tirzepatide  (MOUNJARO ) 15 MG/0.5ML Pen  Obesity Obesity management with Mounjaro  15 mg weekly. Patient has lost 32 pounds and reports improved glycemic control. Struggles with diet, exercise, and sleep. Discussed potential insulin  discontinuation as weight loss progresses, noting insulin 's obesogenic effects. - Continue Mounjaro  15 mg weekly - Encourage adherence to dietary and exercise regimen - Discuss potential for future insulin  discontinuation - Monitor weight and blood glucose levels  Type 2 Diabetes Mellitus Improved glycemic control with A1c decreased to 6.9% from previous level of 7.0. Currently on Tresiba  insulin  30 units daily and using a continuous  glucose monitor. Discussed potential insulin  reduction with continued weight loss  Continue/refill Mounjaro  15 mg weekly.  She is working  on engineer, technical sales to decrease simple carbohydrates, increase lean proteins and exercise to promote weight loss and improve glycemic control . - Continue Tresiba  insulin  30 units daily - Monitor blood glucose levels with continuous glucose monitor - Encourage dietary and exercise modifications - Recheck A1c in follow-up visit  Hypertension Blood pressure well-controlled at 100/59 mmHg on olmesartan  20 mg daily, with occasional use of hydrochlorothiazide 12.5 mg and Lasix. Discussed potential future reduction of antihypertensive medications if blood pressure remains well-controlled. - Continue olmesartan  20 mg daily - Use hydrochlorothiazide 12.5 mg and Lasix as needed - Monitor blood pressure regularly - Consider future reduction of antihypertensive medications with continued treatment with Mounjaro  and continued weight loss.   Hyperlipidemia No specific discussion of lipid levels or current management in this visit. - Continue current management - Monitor lipid levels in future visits  Vitamin D  Deficiency Vitamin D  level at 42 ng/mL with weekly supplementation On Ergocalciferol  . Goal is 50-60 ng/mL. Discussed potential symptoms of hypervitaminosis D, but current levels are safe. - Continue weekly vitamin D  supplementation-Ergocalciferol  50K units weekly - Order new prescription for weekly vitamin D  - Recheck vitamin D  levels in follow-up visit  General Health Maintenance Encouraged physical activity and healthy eating. Discussed potential new medication (triple G) for weight loss and diabetes management in the future. - Encourage regular physical activity, aiming for at least 3 days per week - Recommend dietary modifications to support weight loss and glycemic control - Discuss potential new medication (triple G) for weight loss and diabetes  management  Follow-up - Schedule fasting labs for February 11th at 8:30 AM - Hydrate well the night before and morning of the lab test - Continue regular follow-up with endocrinology and cardiology in March.  Vitals Temp: 98.2 F (36.8 C) BP: (!) 100/59 Pulse Rate: 84 SpO2: 96 %   Anthropometric Measurements Height: 5' 3 (1.6 m) Weight: 222 lb (100.7 kg) BMI (Calculated): 39.34 Weight at Last Visit: 224 lb Weight Lost Since Last Visit: 2 lb Weight Gained Since Last Visit: 0 Starting Weight: 254 lb Total Weight Loss (lbs): 32 lb (14.5 kg)   Body Composition  Body Fat %: 45.6 % Fat Mass (lbs): 101.6 lbs Muscle Mass (lbs): 114.8 lbs Total Body Water (lbs): 79.2 lbs Visceral Fat Rating : 14   Other Clinical Data Fasting: yes Labs: no Today's Visit #: 16 Starting Date: 07/12/22     ASSESSMENT AND PLAN:  Diet: Shreshta is currently in the action stage of change. As such, her goal is to continue with weight loss efforts. She has agreed to Category 3 Plan.  Exercise: Sherrey has been instructed to work up to a goal of 150 minutes of combined cardio and strengthening exercise per week for weight loss and overall health benefits.   Behavior Modification:  We discussed the following Behavioral Modification Strategies today: increasing lean protein intake, decreasing simple carbohydrates, increasing vegetables, increase H2O intake, increase high fiber foods, meal planning and cooking strategies, better snacking choices, avoiding temptations, and planning for success. We discussed various medication options to help Domingue with her weight loss efforts and we both agreed to continue current management for medical weight loss and management of Type 2 diabetes.  Return in about 4 weeks (around 08/08/2023) for Fasting Lab.SABRA She was informed of the importance of frequent follow up visits to maximize her success with intensive lifestyle modifications for her multiple health  conditions.  Attestation Statements:   Reviewed by clinician on day of visit: allergies, medications, problem list, medical history, surgical history, family history, social history, and previous encounter notes.   Time spent on visit including pre-visit chart review and post-visit care and charting was 42 minutes.    Geronimo Diliberto, PA-C

## 2023-07-11 ENCOUNTER — Encounter (INDEPENDENT_AMBULATORY_CARE_PROVIDER_SITE_OTHER): Payer: Self-pay | Admitting: Physician Assistant

## 2023-07-11 ENCOUNTER — Ambulatory Visit (INDEPENDENT_AMBULATORY_CARE_PROVIDER_SITE_OTHER): Payer: BC Managed Care – PPO | Admitting: Physician Assistant

## 2023-07-11 VITALS — BP 100/59 | HR 84 | Temp 98.2°F | Ht 63.0 in | Wt 222.0 lb

## 2023-07-11 DIAGNOSIS — Z7985 Long-term (current) use of injectable non-insulin antidiabetic drugs: Secondary | ICD-10-CM

## 2023-07-11 DIAGNOSIS — I152 Hypertension secondary to endocrine disorders: Secondary | ICD-10-CM | POA: Diagnosis not present

## 2023-07-11 DIAGNOSIS — E1169 Type 2 diabetes mellitus with other specified complication: Secondary | ICD-10-CM

## 2023-07-11 DIAGNOSIS — E785 Hyperlipidemia, unspecified: Secondary | ICD-10-CM

## 2023-07-11 DIAGNOSIS — E669 Obesity, unspecified: Secondary | ICD-10-CM

## 2023-07-11 DIAGNOSIS — E1159 Type 2 diabetes mellitus with other circulatory complications: Secondary | ICD-10-CM | POA: Diagnosis not present

## 2023-07-11 DIAGNOSIS — Z794 Long term (current) use of insulin: Secondary | ICD-10-CM

## 2023-07-11 DIAGNOSIS — Z6839 Body mass index (BMI) 39.0-39.9, adult: Secondary | ICD-10-CM

## 2023-07-11 DIAGNOSIS — K5904 Chronic idiopathic constipation: Secondary | ICD-10-CM

## 2023-07-11 DIAGNOSIS — E559 Vitamin D deficiency, unspecified: Secondary | ICD-10-CM

## 2023-07-11 MED ORDER — VITAMIN D (ERGOCALCIFEROL) 1.25 MG (50000 UNIT) PO CAPS: 50000.0000 [IU] | ORAL_CAPSULE | ORAL | 0 refills | Status: DC

## 2023-07-11 MED ORDER — TIRZEPATIDE 15 MG/0.5ML ~~LOC~~ SOAJ: 15.0000 mg | SUBCUTANEOUS | 1 refills | Status: DC

## 2023-07-29 LAB — HM DIABETES EYE EXAM

## 2023-08-13 ENCOUNTER — Ambulatory Visit (INDEPENDENT_AMBULATORY_CARE_PROVIDER_SITE_OTHER): Payer: BC Managed Care – PPO | Admitting: Physician Assistant

## 2023-08-13 ENCOUNTER — Encounter (INDEPENDENT_AMBULATORY_CARE_PROVIDER_SITE_OTHER): Payer: Self-pay | Admitting: Physician Assistant

## 2023-08-13 VITALS — BP 114/63 | HR 89 | Temp 97.7°F | Ht 63.0 in | Wt 220.0 lb

## 2023-08-13 DIAGNOSIS — I152 Hypertension secondary to endocrine disorders: Secondary | ICD-10-CM

## 2023-08-13 DIAGNOSIS — E785 Hyperlipidemia, unspecified: Secondary | ICD-10-CM

## 2023-08-13 DIAGNOSIS — Z794 Long term (current) use of insulin: Secondary | ICD-10-CM

## 2023-08-13 DIAGNOSIS — Z7985 Long-term (current) use of injectable non-insulin antidiabetic drugs: Secondary | ICD-10-CM

## 2023-08-13 DIAGNOSIS — E1159 Type 2 diabetes mellitus with other circulatory complications: Secondary | ICD-10-CM

## 2023-08-13 DIAGNOSIS — E669 Obesity, unspecified: Secondary | ICD-10-CM

## 2023-08-13 DIAGNOSIS — E1169 Type 2 diabetes mellitus with other specified complication: Secondary | ICD-10-CM

## 2023-08-13 DIAGNOSIS — Z6838 Body mass index (BMI) 38.0-38.9, adult: Secondary | ICD-10-CM

## 2023-08-13 DIAGNOSIS — E559 Vitamin D deficiency, unspecified: Secondary | ICD-10-CM

## 2023-08-13 DIAGNOSIS — Z6839 Body mass index (BMI) 39.0-39.9, adult: Secondary | ICD-10-CM

## 2023-08-13 NOTE — Progress Notes (Signed)
SUBJECTIVE: Discussed the use of AI scribe software for clinical note transcription with the patient, who gave verbal consent to proceed.  Chief Complaint: Obesity  Interim History: She is down 2 lbs since her last visit.  Down 34 lbs overall. TBW loss of 13.4%  Loretta Luna is a 53 year old female with obesity who presents for follow-up of her obesity treatment plan.  She has been on Mounjaro 15 mg weekly and Tresiba insulin, currently at 22 units, which she has been adjusting by dropping 2 units every two weeks. She experiences fluctuating blood sugar levels and has not reduced the insulin further due to these fluctuations. Greggory Keen may be starting to work as she sometimes does not feel hungry, although at other times she experiences increased appetite and nausea.  She has a history of type 2 diabetes and uses a continuous glucose monitor. Certain foods, like canned vegetables, can cause her blood sugar to spike, particularly mentioning corn as a possible culprit. She has been trying to manage her diet by avoiding bread and potatoes, and she has been consuming more protein, vegetables, and greens. She occasionally indulges in foods like waffle fries but notes that they do not significantly spike her blood sugar.  Her medication regimen includes Tresiba insulin, hydrochlorothiazide, Crestor, Benicar, KCL, Vascepa, Lasix, Nexlizet, vitamin D 50,000 units weekly, and vitamin B12 1,000 mcg daily. She has been struggling with dietary changes, particularly with eggs and dairy, and is trying to find suitable protein sources. She enjoys meat and vegetables but is 'burnt out' on yogurt and does not consume much cheese.  She has been less active than she would like but plans to start going back to the gym. Recently, she has been engaging in activities like painting and walking around stores for exercise. She wants to be more active and is working on incorporating more physical activity into her  routine.  Loretta Luna is here to discuss her progress with her obesity treatment plan. She is on the Category 3 Plan and states she is following her eating plan approximately 25 % of the time. She states she is exercising walking 30 minutes 3 times per week.   OBJECTIVE: Visit Diagnoses: Problem List Items Addressed This Visit     Hyperlipidemia associated with type 2 diabetes mellitus ([name])   Hypertension associated with diabetes ([name])   Vitamin D deficiency   Obesity ([name])- Starting BMI 45.06/DATE 07/12/22   Diabetes mellitus ([name]) - Primary   BMI 39.0-39.9,adult Current BMI 39.6  Obesity Loretta Luna, 52, on Mounjaro 15 mg weekly and Tresiba insulin, reports fluctuating insulin doses (currently 22 units), variable hunger, and nausea. Struggles with dietary choices and maintaining exercise routine. Discussed high-protein, low-carb dietary options and prepared meals for convenience. Encouraged resuming gym activities. - Continue Mounjaro 15 mg weekly - Continue Tresiba insulin - Encourage high-protein, low-carb dietary options Provided hand outs for Protein shakes- both prepared on home made.  Provided hand outs for lunch bowl options.  - Recommend Loretta Luna's prepared meats for quick, healthy meals - Suggest protein shakes and smoothies for breakfast - Encourage resuming gym activities - Follow up in 4 weeks  Type 2 Diabetes Mellitus Managed with Tresiba insulin and continuous glucose monitoring. Reports fluctuating blood glucose levels, particularly with high glycemic index foods like corn and [place] fries. Discussed avoiding high glycemic index foods and focusing on low-carb, high-protein meals to stabilize blood glucose levels. - Continue Tresiba insulin Continue Mounjaro 15 mg weekly. No refill needed this visit.  -  Monitor blood glucose levels with continuous glucose monitor - Avoid high glycemic index foods - Encourage low-carb, high-protein meals She is working  on nutrition plan to  decrease simple carbohydrates, increase lean proteins and exercise to promote weight loss and improve glycemic control . - Follow up in 4 weeks  Hyperlipidemia Managed with Crestor, Nexlizet and Vascepa. Discussed dietary modifications to include low-fat options and high-protein meals to manage cholesterol levels. Last lipids Lab Results  Component Value Date   CHOL 129 06/12/2023   HDL 43 06/12/2023   LDLCALC 45 06/12/2023   TRIG 263 (H) 06/12/2023   CHOLHDL 4.7 Ratio 03/28/2007   - Continue Crestor Continue Nexlizet - Continue Vascepa Continue to work on nutrition plan -decreasing simple carbohydrates, increasing lean proteins, decreasing saturated fats and cholesterol , avoiding trans fats and exercise as able to promote weight loss, improve lipids and decrease cardiovascular risks. - Follow up in 4 weeks  Hypertension Managed with hydrochlorothiazide, Benicar, and Lasix. Discussed the importance of maintaining a low-sodium diet to manage blood pressure. Wt Readings from Last 3 Encounters:  08/13/23 220 lb (99.8 kg)  07/11/23 222 lb (100.7 kg)  06/12/23 224 lb (101.6 kg)   Temp Readings from Last 3 Encounters:  08/13/23 97.7 F (36.5 C)  07/11/23 98.2 F (36.8 C)  06/12/23 98.2 F (36.8 C)   BP Readings from Last 3 Encounters:  08/13/23 114/63  07/11/23 (!) 100/59  06/12/23 114/73   Pulse Readings from Last 3 Encounters:  08/13/23 89  07/11/23 84  06/12/23 80   - Continue hydrochlorothiazide - Continue Benicar - Continue Lasix No side effects with medications.  Continue to work on nutrition plan to promote weight loss and improve BP control.  No symptoms of hypotension with continued gradual weight loss on Mounjaro.  - Follow up in 4 weeks  Vitamin D Deficiency Managed with vitamin D 50,000 units weekly. No N/V or muscle weakness with Ergocalciferol. Vitamin D level not at goal of 50-70 Last vitamin D Lab Results  Component Value Date   VD25OH 42.5  06/12/2023   Slightly elevated calcium level on last lab. Will follow.  Low vitamin D levels can be associated with adiposity and may result in leptin resistance and weight gain. Also associated with fatigue.  Currently on vitamin D supplementation without any adverse effects such as nausea, vomiting or muscle weakness.  - Follow up in 4 weeks  General Health Maintenance Discussed the importance of maintaining a balanced diet, regular exercise, and stress management. Provided dietary suggestions and encouraged resuming gym activities. - Encourage balanced diet with high-protein, low-carb options - Encourage regular exercise - Recommend stress management techniques - Follow up in 4 weeks  Follow-up - Follow up in 4 weeks   Vitals Temp: 97.7 F (36.5 C) BP: 114/63 Pulse Rate: 89 SpO2: 96 %   Anthropometric Measurements Height: 5\' 3"  (1.6 m) Weight: 220 lb (99.8 kg) BMI (Calculated): 38.98 Weight at Last Visit: 222lb Weight Lost Since Last Visit: 2lb Weight Gained Since Last Visit: 0lb Starting Weight: 254lb Total Weight Loss (lbs): 34 lb (15.4 kg)   Body Composition  Body Fat %: 45 % Fat Mass (lbs): 99.2 lbs Muscle Mass (lbs): 115.2 lbs Total Body Water (lbs): 79.8 lbs Visceral Fat Rating : 13   Other Clinical Data Fasting: Yes Labs: Yes Today's Visit #: 17 Starting Date: 07/12/22    ASSESSMENT AND PLAN:  Diet: Loretta Luna is currently in the action stage of change. As such, her goal is to  continue with weight loss efforts. She has agreed to Category 3 Plan.  Exercise: Loretta Luna has been instructed to work up to a goal of 150 minutes of combined cardio and strengthening exercise per week for weight loss and overall health benefits.   Behavior Modification:  We discussed the following Behavioral Modification Strategies today: increasing lean protein intake, decreasing simple carbohydrates, increasing vegetables, increase H2O intake, increase high fiber foods, no  skipping meals, meal planning and cooking strategies, avoiding temptations, and planning for success. We discussed various medication options to help Loretta Luna with her weight loss efforts and we both agreed to continue Mounjaro 15 mg weekly and continue to work on nutritional and behavioral strategies to promote weight loss.  .  Return in about 4 weeks (around 09/10/2023).Marland Kitchen She was informed of the importance of frequent follow up visits to maximize her success with intensive lifestyle modifications for her multiple health conditions.  Attestation Statements:   Reviewed by clinician on day of visit: allergies, medications, problem list, medical history, surgical history, family history, social history, and previous encounter notes.   Time spent on visit including pre-visit chart review and post-visit care and charting was 45 minutes.    Martine Bleecker, PA-C

## 2023-09-04 DIAGNOSIS — L700 Acne vulgaris: Secondary | ICD-10-CM | POA: Diagnosis not present

## 2023-09-04 DIAGNOSIS — D2272 Melanocytic nevi of left lower limb, including hip: Secondary | ICD-10-CM | POA: Diagnosis not present

## 2023-09-04 DIAGNOSIS — L905 Scar conditions and fibrosis of skin: Secondary | ICD-10-CM | POA: Diagnosis not present

## 2023-09-04 DIAGNOSIS — L918 Other hypertrophic disorders of the skin: Secondary | ICD-10-CM | POA: Diagnosis not present

## 2023-09-10 ENCOUNTER — Ambulatory Visit (INDEPENDENT_AMBULATORY_CARE_PROVIDER_SITE_OTHER): Admitting: Internal Medicine

## 2023-09-10 ENCOUNTER — Encounter (INDEPENDENT_AMBULATORY_CARE_PROVIDER_SITE_OTHER): Payer: Self-pay | Admitting: Internal Medicine

## 2023-09-10 VITALS — BP 108/71 | HR 76 | Temp 98.1°F | Ht 63.0 in | Wt 222.0 lb

## 2023-09-10 DIAGNOSIS — E66813 Obesity, class 3: Secondary | ICD-10-CM

## 2023-09-10 DIAGNOSIS — Z6841 Body Mass Index (BMI) 40.0 and over, adult: Secondary | ICD-10-CM

## 2023-09-10 DIAGNOSIS — D849 Immunodeficiency, unspecified: Secondary | ICD-10-CM | POA: Diagnosis not present

## 2023-09-10 DIAGNOSIS — R300 Dysuria: Secondary | ICD-10-CM | POA: Insufficient documentation

## 2023-09-10 DIAGNOSIS — E669 Obesity, unspecified: Secondary | ICD-10-CM

## 2023-09-10 DIAGNOSIS — E1169 Type 2 diabetes mellitus with other specified complication: Secondary | ICD-10-CM

## 2023-09-10 DIAGNOSIS — Z794 Long term (current) use of insulin: Secondary | ICD-10-CM

## 2023-09-10 DIAGNOSIS — Z7985 Long-term (current) use of injectable non-insulin antidiabetic drugs: Secondary | ICD-10-CM

## 2023-09-10 NOTE — Assessment & Plan Note (Signed)
 Patient has lost 32 pounds or approximately 13% of total body weight this is not taking consideration her peak weight and otherwise she has lost more than this on medically supervised weight management plan inclusive of GLP-1.  She was educated on the energy balance model and carb insulin model of obesity today which I feel that both are in effect.  She will work on reducing simple and processed carbs increasing protein intake and incorporating more whole foods.  She will continue on Mounjaro for diabetes and weight management.  She had intolerance to metformin in the past.

## 2023-09-10 NOTE — Assessment & Plan Note (Signed)
 Because of immunocompromise state and previous history of urosepsis we will check a urine analysis and culture.  She does not have an appointment with her PCP who is a courtesy this will be done through our office.  Patient advised to schedule an appointment with her PCP especially if her blood sugars continue to trend upward.

## 2023-09-10 NOTE — Progress Notes (Addendum)
 Office: [phone]  /  Fax: [phone]  Weight Summary And Biometrics  Vitals Temp: 98.1 F (36.7 C) BP: 108/71 Pulse Rate: 76 SpO2: 98 %   Anthropometric Measurements Height: 5\' 3"  (1.6 m) Weight: 222 lb (100.7 kg) BMI (Calculated): 39.34 Weight at Last Visit: 220 lb Weight Lost Since Last Visit: 0 Weight Gained Since Last Visit: 2 lb Starting Weight: 254 lb Total Weight Loss (lbs): 32 lb (14.5 kg) Peak Weight: 289 lb   Body Composition  Body Fat %: 45.9 % Fat Mass (lbs): 101.8 lbs Muscle Mass (lbs): 114.2 lbs Total Body Water (lbs): 82 lbs Visceral Fat Rating : 14    No data recorded Today's Visit #: 18  Starting Date: 07/12/22   Subjective   Chief Complaint: Obesity  Interval History Discussed the use of AI scribe software for clinical note transcription with the patient, who gave verbal consent to proceed.  History of Present Illness   The patient, with obesity, type 2 diabetes, hypertension, and hyperlipidemia, presents for medical weight management.  She is currently on Mounjaro 15 mg once a week and basal insulin for weight management. Despite reducing her insulin from 60 units to 22 units, following her reduced calorie nutrition plan 25% of the time, and exercising three days a week, she has gained two pounds since her last visit. Her exercise routine includes strength training, walking, and yoga. She experiences higher levels of stress and gets five hours of sleep per night. Her diet includes diet [facility], primarily water, and occasional fried chicken.  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 Her blood sugar levels have been elevated recently, ranging from 189 to 224 mg/dL, which she suspects may be due to an infection. She recalls a similar episode last September when her blood sugar normalized after treatment for an infection. She cannot tolerate metformin  due to stomach pain and bleeding, leading to its discontinuation.  She has a history of sepsis following a work trip, which required hospitalization and treatment with broad-spectrum antibiotics. She is vigilant about monitoring her condition, especially after her previous sepsis episode. No fever or nausea reported.  Has not schedule an appointment with her PCP.  She has a history of kidney stones and reports passing stones recently. She has an elevated calcium level, which may contribute to stone formation. Proper hydration and dietary modifications are important to prevent stone formation.  No burning during urination or bladder pain. Occasional back pain is present but not significant.        Challenges affecting patient progress: medical comorbidities, presence of obesogenic drugs, and menopause.    Pharmacotherapy for weight management: She is currently taking Monjauro with diabetes as the primary indication with adequate clinical response  and without side effects..   Assessment and Plan   Treatment Plan For Obesity:  Recommended Dietary Goals  Merrill is currently in the action stage of change. As such, her goal is to continue weight management plan. She has agreed to: continue current plan  Behavioral Health and Counseling  We discussed the following behavioral modification strategies today: increasing lean protein intake to established goals, decreasing simple carbohydrates , increasing vegetables, increasing lower glycemic fruits, increasing fiber rich foods, avoiding skipping meals, and increasing water intake .  Additional education and resources provided today: Handout on the carbohydrate insulin model of obesity  Recommended Physical Activity Goals  Kammi has been advised to work up  to 150 minutes of moderate intensity aerobic activity a week and strengthening exercises 2-3 times per week for cardiovascular health, weight loss maintenance and preservation of muscle mass.    She has agreed to :  continue to gradually increase the amount and intensity of exercise routine  Pharmacotherapy  We discussed various medication options to help Nirali with her weight loss efforts and we both agreed to : adequate clinical response to current dose, continue current regimen  Associated Conditions Impacted by Obesity Treatment  Dysuria Assessment & Plan: Because of immunocompromise state and previous history of urosepsis we will check a urine analysis and culture.  She does not have an appointment with her PCP who is a courtesy this will be done through our office.  Patient advised to schedule an appointment with her PCP especially if her blood sugars continue to trend upward.  Orders: -     UA/M w/rflx Culture, Routine  Type 2 diabetes mellitus with obesity ([name]) Assessment & Plan: Her last hemoglobin A1c is 7.0 slightly above target for age.  She had intolerance to metformin in the past.  Complications include possible neuropathy.  She had normal GFR recently.  Her blood pressure is adequately controlled she is also on high intensity statin therapy.  Patient was educated today on the carb insulin model of obesity and was provided additional information.  She will work on reducing simple and processed carbs in her diet and increasing protein intake.   Acquired immunocompromised state [facility]) Assessment & Plan: She has history of diabetes as well as urosepsis in the past, she is noticing increased in blood sugars and is concerned about developing bladder infection.  We will check a urinalysis with culture.  If this is negative I recommend that she schedule an appointment with her PCP to help troubleshoot unexplained elevations in her blood sugars.   Class 3 severe obesity with serious comorbidity and body mass index (BMI) of 45.0 to 49.9 in adult, unspecified obesity type [facility]) Assessment & Plan: Patient has lost 32 pounds or approximately 13% of total body weight this is  not taking consideration her peak weight and otherwise she has lost more than this on medically supervised weight management plan inclusive of GLP-1.  She was educated on the energy balance model and carb insulin model of obesity today which I feel that both are in effect.  She will work on reducing simple and processed carbs increasing protein intake and incorporating more whole foods.  She will continue on Mounjaro for diabetes and weight management.  She had intolerance to metformin in the past.     Addendum 09/16/2023: Called patient to inform of urine culture results positive for Enterococcus and E. coli we will prescribe ciprofloxacin 500 mg twice daily for 7 days for complicated UTI.  Follow-up Scheduled appointment with endocrinologist next week. Advised follow-up with primary care physician for management of elevated blood glucose levels and potential infection. Highlighted the importance of ruling out infection to prevent complications. - Follow up with endocrinologist next week - Schedule appointment with primary care physician for further evaluation of blood glucose levels and potential infection - Go to [facility] for urinalysis with culture          Objective   Physical Exam:  Blood pressure 108/71, pulse 76, temperature 98.1 F (36.7 C), height 5\' 3"  (1.6 m), weight 222 lb (100.7 kg), SpO2 98%. Body mass index is 39.33 kg/m.  General: She is overweight, cooperative, alert, well developed, and in no acute  distress. PSYCH: Has normal mood, affect and thought process.   HEENT: EOMI, sclerae are anicteric. Lungs: Normal breathing effort, no conversational dyspnea. Extremities: No edema.  Neurologic: No gross sensory or motor deficits. No tremors or fasciculations noted.    Diagnostic Data Reviewed:  BMET    Component Value Date/Time   NA 143 06/12/2023 1049   K 4.5 06/12/2023 1049   CL 105 06/12/2023 1049   CO2 24 06/12/2023 1049   GLUCOSE 108 (H) 06/12/2023 1049    GLUCOSE 120 (H) 07/15/2012 1058   BUN 19 06/12/2023 1049   CREATININE 0.70 06/12/2023 1049   CREATININE 0.77 07/15/2012 1058   CALCIUM 10.3 (H) 06/12/2023 1049   Lab Results  Component Value Date   HGBA1C 7.0 (H) 06/12/2023   HGBA1C 5.5 07/01/2006   Lab Results  Component Value Date   INSULIN 17.4 07/12/2022   Lab Results  Component Value Date   TSH 3.110 07/12/2022   CBC    Component Value Date/Time   WBC 7.2 06/06/2022 0000   WBC 8.2 07/15/2012 1058   RBC 5.81 (A) 06/06/2022 0000   HGB 15.8 06/06/2022 0000   HCT 49 (A) 06/06/2022 0000   PLT 317 06/06/2022 0000   MCV 78.0 07/15/2012 1058   MCH 26.3 07/15/2012 1058   MCHC 33.7 07/15/2012 1058   RDW 15.5 07/15/2012 1058   Iron Studies No results found for: "IRON", "TIBC", "FERRITIN", "IRONPCTSAT" Lipid Panel     Component Value Date/Time   CHOL 129 06/12/2023 1049   TRIG 263 (H) 06/12/2023 1049   HDL 43 06/12/2023 1049   CHOLHDL 4.7 Ratio 03/28/2007 2157   VLDL NOT CALC mg/dL 52/84/1324 4010   LDLCALC 45 06/12/2023 1049   Hepatic Function Panel     Component Value Date/Time   PROT 6.8 06/12/2023 1049   ALBUMIN 4.5 06/12/2023 1049   AST 21 06/12/2023 1049   ALT 24 06/12/2023 1049   ALKPHOS 89 06/12/2023 1049   BILITOT 0.4 06/12/2023 1049      Component Value Date/Time   TSH 3.110 07/12/2022 1055   Nutritional Lab Results  Component Value Date   VD25OH 42.5 06/12/2023   VD25OH 56.7 03/28/2023   VD25OH 32.7 11/20/2022    Medications: Outpatient Encounter Medications as of 09/10/2023  Medication Sig   Bempedoic Acid-Ezetimibe (NEXLIZET) 180-10 MG TABS Take by mouth.   Continuous Glucose Sensor (DEXCOM G7 SENSOR) MISC Change sensor every 10 days as directed.   cyanocobalamin (VITAMIN B12) 500 MCG tablet Take 1 tablet (500 mcg total) by mouth daily. (Patient taking differently: Take 1,000 mcg by mouth daily.)   furosemide (LASIX) 20 MG tablet Take 20 mg by mouth.   HYDROCHLOROTHIAZIDE PO Take by  mouth.   icosapent Ethyl (VASCEPA) 1 g capsule Take 2 g by mouth 2 (two) times daily.   Insulin Pen Needle (PEN NEEDLES) 31G X 8 MM MISC Use to inject insulin once daily   loratadine (CLARITIN) 10 MG tablet Take 10 mg by mouth daily.   Magnesium 250 MG TABS May use CALM supplement- 325mg  mag 1-twice daily   olmesartan (BENICAR) 20 MG tablet Take 1 tablet (20 mg total) by mouth daily.   potassium chloride (KLOR-CON) 10 MEQ tablet Take 10 mEq by mouth 2 (two) times daily.   rosuvastatin (CRESTOR) 20 MG tablet Take 20 mg by mouth daily.   tirzepatide (MOUNJARO) 15 MG/0.5ML Pen Inject 15 mg into the skin once a week.   TRESIBA FLEXTOUCH 200 UNIT/ML FlexTouch Pen Inject 30  Units into the skin at bedtime.   Vitamin D, Ergocalciferol, (DRISDOL) 1.25 MG (50000 UNIT) CAPS capsule Take 1 capsule (50,000 Units total) by mouth every 7 (seven) days.   No facility-administered encounter medications on file as of 09/10/2023.     Follow-Up   Return for Follow-up with Shawn as scheduled.. She was informed of the importance of frequent follow up visits to maximize her success with intensive lifestyle modifications for her multiple health conditions.  Attestation Statement   Reviewed by clinician on day of visit: allergies, medications, problem list, medical history, surgical history, family history, social history, and previous encounter notes.   I have spent 40 minutes in the care of the patient today including: preparing to see patient (e.g. review and interpretation of tests, old notes ), obtaining and/or reviewing separately obtained history, performing a medically appropriate examination or evaluation, counseling and educating the patient, ordering medications, test or procedures, documenting clinical information in the electronic or other health care record, and independently interpreting results and communicating results to the patient, family, or caregiver   Worthy Rancher, MD

## 2023-09-10 NOTE — Assessment & Plan Note (Signed)
 Her last hemoglobin A1c is 7.0 slightly above target for age.  She had intolerance to metformin in the past.  Complications include possible neuropathy.  She had normal GFR recently.  Her blood pressure is adequately controlled she is also on high intensity statin therapy.  Patient was educated today on the carb insulin model of obesity and was provided additional information.  She will work on reducing simple and processed carbs in her diet and increasing protein intake.

## 2023-09-10 NOTE — Assessment & Plan Note (Signed)
 She has history of diabetes as well as urosepsis in the past, she is noticing increased in blood sugars and is concerned about developing bladder infection.  We will check a urinalysis with culture.  If this is negative I recommend that she schedule an appointment with her PCP to help troubleshoot unexplained elevations in her blood sugars.

## 2023-09-11 ENCOUNTER — Ambulatory Visit (INDEPENDENT_AMBULATORY_CARE_PROVIDER_SITE_OTHER): Payer: BC Managed Care – PPO | Admitting: Physician Assistant

## 2023-09-11 DIAGNOSIS — R300 Dysuria: Secondary | ICD-10-CM | POA: Diagnosis not present

## 2023-09-11 DIAGNOSIS — E785 Hyperlipidemia, unspecified: Secondary | ICD-10-CM | POA: Diagnosis not present

## 2023-09-11 DIAGNOSIS — E1169 Type 2 diabetes mellitus with other specified complication: Secondary | ICD-10-CM | POA: Diagnosis not present

## 2023-09-12 LAB — NMR, LIPOPROFILE
Cholesterol, Total: 153 mg/dL (ref 100–199)
HDL Particle Number: 41.4 umol/L (ref >=30.5)
HDL-C: 50 mg/dL (ref >39)
LDL Particle Number: 919 nmol/L (ref <1000)
LDL Size: 19.7 nm — ABNORMAL LOW (ref >20.5)
LDL-C (NIH Calc): 60 mg/dL (ref 0–99)
LP-IR Score: 76 — ABNORMAL HIGH (ref <=45)
Small LDL Particle Number: 727 nmol/L — ABNORMAL HIGH (ref <=527)
Triglycerides: 270 mg/dL — ABNORMAL HIGH (ref 0–149)

## 2023-09-15 LAB — MICROSCOPIC EXAMINATION
Bacteria, UA: NONE SEEN
Casts: NONE SEEN /LPF
RBC, Urine: NONE SEEN /HPF (ref 0–2)

## 2023-09-15 LAB — UA/M W/RFLX CULTURE, ROUTINE
Bilirubin, UA: NEGATIVE
Glucose, UA: NEGATIVE
Ketones, UA: NEGATIVE
Nitrite, UA: NEGATIVE
RBC, UA: NEGATIVE
Specific Gravity, UA: 1.027 (ref 1.005–1.030)
Urobilinogen, Ur: 0.2 mg/dL (ref 0.2–1.0)
pH, UA: 5.5 (ref 5.0–7.5)

## 2023-09-15 LAB — URINE CULTURE, REFLEX

## 2023-09-16 ENCOUNTER — Encounter (INDEPENDENT_AMBULATORY_CARE_PROVIDER_SITE_OTHER): Payer: Self-pay | Admitting: Internal Medicine

## 2023-09-16 ENCOUNTER — Other Ambulatory Visit: Payer: Self-pay | Admitting: *Deleted

## 2023-09-16 DIAGNOSIS — E1169 Type 2 diabetes mellitus with other specified complication: Secondary | ICD-10-CM

## 2023-09-16 MED ORDER — CIPROFLOXACIN HCL 500 MG PO TABS: 500.0000 mg | ORAL_TABLET | Freq: Two times a day (BID) | ORAL | 0 refills | Status: AC

## 2023-09-16 MED ORDER — TIRZEPATIDE 15 MG/0.5ML ~~LOC~~ SOAJ: 15.0000 mg | SUBCUTANEOUS | 1 refills | Status: DC

## 2023-09-16 NOTE — Telephone Encounter (Signed)
 Patient called in asking that Dr. Rikki Spearing review her message as she is diabetic and wondering if he will send in a prescription for an antibiotic. Patient asked that someone follow up with her today.

## 2023-09-16 NOTE — Addendum Note (Signed)
 Addended by: Lyda Kalata on: 09/16/2023 05:00 PM   Modules accepted: Orders

## 2023-09-17 ENCOUNTER — Ambulatory Visit (HOSPITAL_BASED_OUTPATIENT_CLINIC_OR_DEPARTMENT_OTHER): Payer: BC Managed Care – PPO | Admitting: Internal Medicine

## 2023-09-17 VITALS — BP 118/66 | HR 79 | Ht 63.0 in | Wt 224.7 lb

## 2023-09-17 DIAGNOSIS — I10 Essential (primary) hypertension: Secondary | ICD-10-CM

## 2023-09-17 DIAGNOSIS — E1169 Type 2 diabetes mellitus with other specified complication: Secondary | ICD-10-CM

## 2023-09-17 DIAGNOSIS — R011 Cardiac murmur, unspecified: Secondary | ICD-10-CM | POA: Diagnosis not present

## 2023-09-17 DIAGNOSIS — E7849 Other hyperlipidemia: Secondary | ICD-10-CM | POA: Diagnosis not present

## 2023-09-17 DIAGNOSIS — E785 Hyperlipidemia, unspecified: Secondary | ICD-10-CM

## 2023-09-17 NOTE — Progress Notes (Signed)
 LIPID CLINIC CONSULT NOTE  Chief Complaint:  Follow-up dyslipidemia  Primary Care Physician: Benita Stabile, MD  Primary Cardiologist:  Chrystie Nose, MD  HPI:  Loretta Luna is a 53 y.o. female who is being seen today for the evaluation of dyslipidemia at the request of Benita Stabile, MD. this is a pleasant 53 year old female kindly referred for evaluation management of dyslipidemia.  Primarily she has had a history of very high triglycerides which she reports has been over 3000 in the past.  More recently lab work shows that her triglycerides have been in the 800s.  She has been primarily managed by her PCP who has her on quite a few cardiovascular/lipid-lowering medications, including Vascepa, rosuvastatin, Repatha and Nexlizet.  Her last labs in August showed total cholesterol 109, HDL 39, triglycerides 476 and LDL of 8, apparently then she started the Repatha subsequent to these labs.  She is not currently on a fibrate but reports having taken it in the past although is unclear why it was discontinued.  I did receive referral information from her primary care doctor.  She has been previously diagnosed with familial combined hyperlipidemia.  She did have a calcium score which reportedly showed no coronary calcium.  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  09/07/2022  Loretta Luna returns today for follow-up of her dyslipidemia.  Previously I felt that she was being overtreated on 5 different cholesterol medications.  I advised her to stop her Repatha as her cholesterol was essentially undetectable.  Her repeat labs are much more reasonable.  LDL particle number now 529 with an LDL-C of 35, HDL-C43, triglycerides lower at 203 and a small LDL particle number of 377.  Overall a very good-looking lipid profile.  She was happy to be without the additional medication.  She also has  been having some concerns with fatigue and recently had been noted to have some low blood pressure.  She has lost about 10 pounds after working with the [facility] center.  Her blood pressures recently have been in the 80s and 90s systolic.  Today blood pressure was 92/62.   03/29/2023  Loretta Luna is seen today in follow-up of dyslipidemia.  Overall cholesterol continues to be well treated.  LDL particle #586, LDL 40, HDL 43 and triglycerides 233.  Triglycerides have been fairly variable.  Overall her treatment is very good.  She continues to have issues with low blood pressure.  I previously stopped her chlorthalidone.  She is lost about 30 pounds through the [facility] weight loss program.  I suspect this is why worsening lower blood pressures.  She has had some fatigue.  She does have HCTZ on her list to use as needed.  09/17/2023  Loretta Luna is seen today in follow-up.  She has had recent increase in her cholesterol.  Her LDL particle number is now 919 with a small LDL particle number of 727.  LDL has gone up to 60 with HDL 50 and triglycerides 270.  She reports compliance with her medications including Vascepa, rosuvastatin and Nexlizet.  She also inquired today about a history of heart murmur.  She was told she had this in the past and she notes that she can sometimes hear her heartbeat in her ear when laying on her pillow.  Review of the chart when she was previously seeing Dr. Diona Browner demonstrated that she had  an echo in 2023 which I personally read that showed normal systolic function, mild LVH and mild diastolic dysfunction without any significant valvular disease.  PMHx:  Past Medical History:  Diagnosis Date   Anxiety    Asthma    Back pain    Chest pain    Constipation    Depression    Diabetes ([name])    Essential hypertension    Fatty liver    GERD (gastroesophageal reflux disease)    History of swelling of feet    Hypercalcemia    Hyperglycemia    Hypokalemia    IBS  (irritable bowel syndrome)    Infertility, female    Insomnia    Joint pain    Mixed hyperlipidemia    Obesity    Pancreatic cyst    Polycystic ovarian syndrome    Proteinuria    Systolic murmur    Type 2 diabetes mellitus ([name])     Past Surgical History:  Procedure Laterality Date   CHOLECYSTECTOMY      FAMHx:  Family History  Problem Relation Age of Onset   COPD Mother    Hyperlipidemia Mother    Hypertension Mother    Stroke Mother    Diabetes Mother    Heart disease Mother    Obesity Mother    Hyperlipidemia Father    Hypertension Father    Stroke Father    Diabetes Father    Sleep apnea Father    Obesity Father    Hypertension Sister    Asthma Brother    Hyperlipidemia Brother    Hypertension Brother    Cancer Maternal Grandmother    COPD Maternal Grandfather    Hyperlipidemia Maternal Grandfather    Cancer Paternal Grandmother    Stroke Paternal Grandfather     SOCHx:   reports that she has never smoked. She has never used smokeless tobacco. She reports that she does not drink alcohol and does not use drugs.  ALLERGIES:  Allergies  Allergen Reactions   Iodinated Contrast Media Hives and Swelling    Ok with 13 hour premed   Food     shellfish   Iodine     REACTION: swelling, hives, tachypnea   Metformin And Related Diarrhea    ROS: Pertinent items noted in HPI and remainder of comprehensive ROS otherwise negative.  HOME MEDS: Current Outpatient Medications on File Prior to Visit  Medication Sig Dispense Refill   Bempedoic Acid-Ezetimibe (NEXLIZET) 180-10 MG TABS Take by mouth.     ciprofloxacin (CIPRO) 500 MG tablet Take 1 tablet (500 mg total) by mouth 2 (two) times daily for 7 days. 14 tablet 0   Continuous Glucose Sensor (DEXCOM G7 SENSOR) MISC Change sensor every 10 days as directed. 9 each 3   cyanocobalamin (VITAMIN B12) 500 MCG tablet Take 1 tablet (500 mcg total) by mouth daily. (Patient taking differently: Take 1,000 mcg by mouth  daily.)     furosemide (LASIX) 20 MG tablet Take 20 mg by mouth as needed.     HYDROCHLOROTHIAZIDE PO Take by mouth as needed.     icosapent Ethyl (VASCEPA) 1 g capsule Take 2 g by mouth 2 (two) times daily.     Insulin Pen Needle (PEN NEEDLES) 31G X 8 MM MISC Use to inject insulin once daily 100 each 6   loratadine (CLARITIN) 10 MG tablet Take 10 mg by mouth daily.     Magnesium 250 MG TABS May use CALM supplement- 325mg  mag 1-twice daily (Patient taking  differently: May use CALM supplement- 325mg  mag 2 pills -twice daily)     olmesartan (BENICAR) 20 MG tablet Take 1 tablet (20 mg total) by mouth daily. 90 tablet 3   potassium chloride (KLOR-CON) 10 MEQ tablet Take 10 mEq by mouth 2 (two) times daily.     rosuvastatin (CRESTOR) 20 MG tablet Take 20 mg by mouth daily.     tirzepatide (MOUNJARO) 15 MG/0.5ML Pen Inject 15 mg into the skin once a week. 6 mL 1   TRESIBA FLEXTOUCH 200 UNIT/ML FlexTouch Pen Inject 30 Units into the skin at bedtime. 15 mL 3   Vitamin D, Ergocalciferol, (DRISDOL) 1.25 MG (50000 UNIT) CAPS capsule Take 1 capsule (50,000 Units total) by mouth every 7 (seven) days. 12 capsule 0   No current facility-administered medications on file prior to visit.    LABS/IMAGING: No results found for this or any previous visit (from the past 48 hours).  No results found.  LIPID PANEL:    Component Value Date/Time   CHOL 129 06/12/2023 1049   TRIG 263 (H) 06/12/2023 1049   HDL 43 06/12/2023 1049   CHOLHDL 4.7 Ratio 03/28/2007 2157   VLDL NOT CALC mg/dL 04/54/0981 1914   LDLCALC 45 06/12/2023 1049    WEIGHTS: Wt Readings from Last 3 Encounters:  09/17/23 224 lb 11.2 oz (101.9 kg)  09/10/23 222 lb (100.7 kg)  08/13/23 220 lb (99.8 kg)    VITALS: BP 118/66   Pulse 79   Ht 5\' 3"  (1.6 m)   Wt 224 lb 11.2 oz (101.9 kg)   LMP  (LMP Unknown)   SpO2 96%   BMI 39.80 kg/m   EXAM: General appearance: alert and no distress Neck: no carotid bruit, no JVD, and thyroid not  enlarged, symmetric, no tenderness/mass/nodules Lungs: clear to auscultation bilaterally Heart: regular rate and rhythm, S1, S2 normal, no murmur, click, rub or gallop Neurologic: Grossly normal Deferred  EKG: Deferred  ASSESSMENT: 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   Loretta Luna has had recent increase in cholesterol although her blood sugar is also gone up and she has had some issues including a recent urinary tract infection.  She says that is being treated and overall she is doing better.  She will continue to work on diet and lifestyle and I would recommend no changes to her medicines today.  She was concerned about the history of murmur however I could not auscultate 1 today.  In addition she has no carotid bruit that I could detect although she has been hearing her heartbeat occasionally in her ear.  Overall her exam is reassuring and her echo in 2023 was not significantly abnormal with regards to valve function.  Follow-up with APP annually or sooner as necessary as a general cardiology patient  Chrystie Nose, MD, [facility], FACP  [place]  [facility]  Medical Director of the [facility]  [facility] Diplomate of the American Board of Clinical Lipidology Attending Cardiologist  Direct Dial: [phone]  Fax: [phone]  Website:  [email or web address] Nicely Crystalmarie Yasin 09/17/2023, 9:42 AM

## 2023-09-17 NOTE — Patient Instructions (Signed)
 Medication Instructions:  NO CHANGES  *If you need a refill on your cardiac medications before your next appointment, please call your pharmacy*  Follow-Up: At [facility], you and your health needs are our priority.  As part of our continuing mission to provide you with exceptional heart care, we have created designated Provider Care Teams.  These Care Teams include your primary Cardiologist (physician) and Advanced Practice Providers (APPs -  Physician Assistants and Nurse Practitioners) who all work together to provide you with the care you need, when you need it.  We recommend signing up for the patient portal called "MyChart".  Sign up information is provided on this After Visit Summary.  MyChart is used to connect with patients for Virtual Visits (Telemedicine).  Patients are able to view lab/test results, encounter notes, upcoming appointments, etc.  Non-urgent messages can be sent to your provider as well.   To learn more about what you can do with MyChart, go to [email or web address].    Your next appointment:   12 months with Dr. Rennis Golden  Other Instructions   1st Floor: - Lobby - Registration  - Pharmacy  - Lab - Cafe  2nd Floor: - PV Lab - Diagnostic Testing (echo, CT, nuclear med)  3rd Floor: - Vacant  4th Floor: - TCTS (cardiothoracic surgery) - AFib Clinic - Structural Heart Clinic - Vascular Surgery  - Vascular Ultrasound  5th Floor: - [facility] (general and EP) - Clinical Pharmacy for coumadin, hypertension, lipid, weight-loss medications, and med management appointments    Valet parking services will be available as well.

## 2023-09-19 ENCOUNTER — Encounter: Payer: Self-pay | Admitting: Nurse Practitioner

## 2023-09-19 ENCOUNTER — Ambulatory Visit: Payer: BC Managed Care – PPO | Admitting: Nurse Practitioner

## 2023-09-19 VITALS — BP 110/74 | HR 76 | Ht 63.0 in | Wt 223.8 lb

## 2023-09-19 DIAGNOSIS — Z794 Long term (current) use of insulin: Secondary | ICD-10-CM | POA: Diagnosis not present

## 2023-09-19 DIAGNOSIS — E119 Type 2 diabetes mellitus without complications: Secondary | ICD-10-CM | POA: Diagnosis not present

## 2023-09-19 DIAGNOSIS — Z7985 Long-term (current) use of injectable non-insulin antidiabetic drugs: Secondary | ICD-10-CM

## 2023-09-19 MED ORDER — TRESIBA FLEXTOUCH 200 UNIT/ML ~~LOC~~ SOPN: 60.0000 [IU] | PEN_INJECTOR | Freq: Every evening | SUBCUTANEOUS | 3 refills | Status: DC

## 2023-09-19 NOTE — Progress Notes (Signed)
 Endocrinology Follow Up Note       09/19/2023, 9:32 AM   Subjective:    Patient ID: Loretta Luna, female    DOB: 03/21/1971.  Loretta Luna is being seen in consultation for management of currently uncontrolled symptomatic diabetes requested by  Benita Stabile, MD.  She also sees healthy weight and wellness in [place].   Past Medical History:  Diagnosis Date   Anxiety    Asthma    Back pain    Chest pain    Constipation    Depression    Diabetes ([name])    Essential hypertension    Fatty liver    GERD (gastroesophageal reflux disease)    History of swelling of feet    Hypercalcemia    Hyperglycemia    Hypokalemia    IBS (irritable bowel syndrome)    Infertility, female    Insomnia    Joint pain    Mixed hyperlipidemia    Obesity    Pancreatic cyst    Polycystic ovarian syndrome    Proteinuria    Systolic murmur    Type 2 diabetes mellitus ([name])     Past Surgical History:  Procedure Laterality Date   CHOLECYSTECTOMY      Social History   Socioeconomic History   Marital status: Single    Spouse name: Not on file   Number of children: Not on file   Years of education: Not on file   Highest education level: Not on file  Occupational History   Not on file  Tobacco Use   Smoking status: Never   Smokeless tobacco: Never  Substance and Sexual Activity   Alcohol use: No    Alcohol/week: 0.0 standard drinks of alcohol   Drug use: Never   Sexual activity: Not Currently  Other Topics Concern   Not on file  Social History Narrative   Not on file   Social Drivers of Health   Financial Resource Strain: Not on file  Food Insecurity: Not on file  Transportation Needs: Not on file  Physical Activity: Insufficiently Active (05/05/2020)   Received from [facility]   Exercise Vital Sign    Days of Exercise per Week: 3 days    Minutes of Exercise per Session: 30 min  Stress: Not on  file  Social Connections: Not on file    Family History  Problem Relation Age of Onset   COPD Mother    Hyperlipidemia Mother    Hypertension Mother    Stroke Mother    Diabetes Mother    Heart disease Mother    Obesity Mother    Hyperlipidemia Father    Hypertension Father    Stroke Father    Diabetes Father    Sleep apnea Father    Obesity Father    Hypertension Sister    Asthma Brother    Hyperlipidemia Brother    Hypertension Brother    Cancer Maternal Grandmother    COPD Maternal Grandfather    Hyperlipidemia Maternal Grandfather    Cancer Paternal Grandmother    Stroke Paternal Grandfather     Outpatient Encounter Medications as of 09/19/2023  Medication Sig   Bempedoic  Acid-Ezetimibe (NEXLIZET) 180-10 MG TABS Take by mouth.   ciprofloxacin (CIPRO) 500 MG tablet Take 1 tablet (500 mg total) by mouth 2 (two) times daily for 7 days.   Continuous Glucose Sensor (DEXCOM G7 SENSOR) MISC Change sensor every 10 days as directed.   cyanocobalamin (VITAMIN B12) 500 MCG tablet Take 1 tablet (500 mcg total) by mouth daily. (Patient taking differently: Take 1,000 mcg by mouth daily.)   furosemide (LASIX) 20 MG tablet Take 20 mg by mouth as needed.   HYDROCHLOROTHIAZIDE PO Take by mouth as needed.   icosapent Ethyl (VASCEPA) 1 g capsule Take 2 g by mouth 2 (two) times daily.   Insulin Pen Needle (PEN NEEDLES) 31G X 8 MM MISC Use to inject insulin once daily   loratadine (CLARITIN) 10 MG tablet Take 10 mg by mouth daily.   Magnesium 250 MG TABS May use CALM supplement- 325mg  mag 1-twice daily (Patient taking differently: May use CALM supplement- 325mg  mag 2 pills -twice daily)   olmesartan (BENICAR) 20 MG tablet Take 1 tablet (20 mg total) by mouth daily.   potassium chloride (KLOR-CON) 10 MEQ tablet Take 10 mEq by mouth 2 (two) times daily.   rosuvastatin (CRESTOR) 20 MG tablet Take 20 mg by mouth daily.   tirzepatide (MOUNJARO) 15 MG/0.5ML Pen Inject 15 mg into the skin once a  week.   Vitamin D, Ergocalciferol, (DRISDOL) 1.25 MG (50000 UNIT) CAPS capsule Take 1 capsule (50,000 Units total) by mouth every 7 (seven) days.   [DISCONTINUED] TRESIBA FLEXTOUCH 200 UNIT/ML FlexTouch Pen Inject 30 Units into the skin at bedtime.   TRESIBA FLEXTOUCH 200 UNIT/ML FlexTouch Pen Inject 60 Units into the skin at bedtime.   No facility-administered encounter medications on file as of 09/19/2023.    ALLERGIES: Allergies  Allergen Reactions   Iodinated Contrast Media Hives and Swelling    Ok with 13 hour premed   Food     shellfish   Iodine     REACTION: swelling, hives, tachypnea   Metformin And Related Diarrhea    VACCINATION STATUS: Immunization History  Administered Date(s) Administered   Influenza Whole 04/10/2007    Diabetes She presents for her follow-up diabetic visit. She has type 2 diabetes mellitus. Onset time: diagnosed at approx age of 41. Her disease course has been stable. There are no hypoglycemic associated symptoms. There are no diabetic associated symptoms. There are no diabetic complications. Risk factors for coronary artery disease include diabetes mellitus, dyslipidemia, family history, obesity, hypertension and sedentary lifestyle. Current diabetic treatment includes insulin injections (and Mounjaro). She is compliant with treatment most of the time (has had trouble getting Mounjaro intermittently due to supply issues). Her weight is fluctuating minimally. She is following a generally healthy diet. Meal planning includes ADA exchanges, avoidance of concentrated sweets, carbohydrate counting and calorie counting. She has had a previous visit with a dietitian. She rarely participates in exercise. Her home blood glucose trend is decreasing steadily. Her overall blood glucose range is 140-180 mg/dl. (She presents today with her CGM, no logs, showing at target glycemic profile, creeping up slightly over last week but she is fighting a UTI.  Her POCT A1c today  read error code and we opted not to restick her finger.  She denies any significant hypoglycemia.  Analysis of her CGM shows TIR 76%, TAR 24%, TBR 0%.  She notes she started lowering her dose of Tresiba in efforts we can continue to wean her off of it.  She is working out  some too.) An ACE inhibitor/angiotensin II receptor blocker is being taken. She sees a podiatrist.Eye exam is current.   Review of systems  Constitutional: + stable body weight,  current Body mass index is 39.64 kg/m. , no fatigue, no subjective hyperthermia, no subjective hypothermia Eyes: no blurry vision, no xerophthalmia ENT: no sore throat, no nodules palpated in throat, no dysphagia/odynophagia, no hoarseness Cardiovascular: no chest pain, no shortness of breath, no palpitations, no leg swelling Respiratory: no cough, no shortness of breath Gastrointestinal: no nausea/vomiting/diarrhea Musculoskeletal: no muscle/joint aches Skin: no rashes, no hyperemia Neurological: no tremors, no numbness, no tingling, no dizziness Psychiatric: no depression, no anxiety  Objective:     BP 110/74 (BP Location: Left Arm, Patient Position: Sitting, Cuff Size: Large)   Pulse 76   Ht 5\' 3"  (1.6 m)   Wt 223 lb 12.8 oz (101.5 kg)   LMP  (LMP Unknown)   BMI 39.64 kg/m   Wt Readings from Last 3 Encounters:  09/19/23 223 lb 12.8 oz (101.5 kg)  09/17/23 224 lb 11.2 oz (101.9 kg)  09/10/23 222 lb (100.7 kg)     BP Readings from Last 3 Encounters:  09/19/23 110/74  09/17/23 118/66  09/10/23 108/71      Physical Exam- Limited  Constitutional:  Body mass index is 39.64 kg/m. , not in acute distress, normal state of mind Eyes:  EOMI, no exophthalmos Musculoskeletal: no gross deformities, strength intact in all four extremities, no gross restriction of joint movements Skin:  no rashes, no hyperemia Neurological: no tremor with outstretched hands   Diabetic Foot Exam - Simple   No data filed    CMP ( most recent) CMP      Component Value Date/Time   NA 143 06/12/2023 1049   K 4.5 06/12/2023 1049   CL 105 06/12/2023 1049   CO2 24 06/12/2023 1049   GLUCOSE 108 (H) 06/12/2023 1049   GLUCOSE 120 (H) 07/15/2012 1058   BUN 19 06/12/2023 1049   CREATININE 0.70 06/12/2023 1049   CREATININE 0.77 07/15/2012 1058   CALCIUM 10.3 (H) 06/12/2023 1049   PROT 6.8 06/12/2023 1049   ALBUMIN 4.5 06/12/2023 1049   AST 21 06/12/2023 1049   ALT 24 06/12/2023 1049   ALKPHOS 89 06/12/2023 1049   BILITOT 0.4 06/12/2023 1049     Diabetic Labs (most recent): Lab Results  Component Value Date   HGBA1C 7.0 (H) 06/12/2023   HGBA1C 6.6 (A) 05/21/2023   HGBA1C 6.0 (A) 01/16/2023     Lipid Panel ( most recent) Lipid Panel     Component Value Date/Time   CHOL 129 06/12/2023 1049   TRIG 263 (H) 06/12/2023 1049   HDL 43 06/12/2023 1049   CHOLHDL 4.7 Ratio 03/28/2007 2157   VLDL NOT CALC mg/dL 82/95/6213 0865   LDLCALC 45 06/12/2023 1049   LABVLDL 41 (H) 06/12/2023 1049      Lab Results  Component Value Date   TSH 3.110 07/12/2022   FREET4 1.10 07/12/2022           Assessment & Plan:   1) Type 2 diabetes mellitus without complication, with long-term current use of insulin  She presents today with her CGM, no logs, showing at target glycemic profile, creeping up slightly over last week but she is fighting a UTI.  Her POCT A1c today read error code and we opted not to restick her finger.  She denies any significant hypoglycemia.  Analysis of her CGM shows TIR 76%, TAR 24%, TBR  0%.  She notes she started lowering her dose of Tresiba in efforts we can continue to wean her off of it.  She is working out some too.  - Loretta Luna has currently uncontrolled symptomatic type 2 DM since 53 years of age.   -Recent labs reviewed.  - I had a long discussion with her about the progressive nature of diabetes and the pathology behind its complications. -her diabetes is not currently complicated but she remains at a high  risk for more acute and chronic complications which include CAD, CVA, CKD, retinopathy, and neuropathy. These are all discussed in detail with her.  The following Lifestyle Medicine recommendations according to American College of Lifestyle Medicine [facility]) were discussed and offered to patient and she agrees to start the journey:  A. Whole Foods, Plant-based plate comprising of fruits and vegetables, plant-based proteins, whole-grain carbohydrates was discussed in detail with the patient.   A list for source of those nutrients were also provided to the patient.  Patient will use only water or unsweetened tea for hydration. B.  The need to stay away from risky substances including alcohol, smoking; obtaining 7 to 9 hours of restorative sleep, at least 150 minutes of moderate intensity exercise weekly, the importance of healthy social connections,  and stress reduction techniques were discussed. C.  A full color page of  Calorie density of various food groups per pound showing examples of each food groups was provided to the patient.  - Nutritional counseling repeated at each appointment due to patients tendency to fall back in to old habits.  - The patient admits there is a room for improvement in their diet and drink choices. -  Suggestion is made for the patient to avoid simple carbohydrates from their diet including Cakes, Sweet Desserts / Pastries, Ice Cream, Soda (diet and regular), Sweet Tea, Candies, Chips, Cookies, Sweet Pastries, Store Bought Juices, Alcohol in Excess of 1-2 drinks a day, Artificial Sweeteners, Coffee Creamer, and "Sugar-free" Products. This will help patient to have stable blood glucose profile and potentially avoid unintended weight gain.   - I encouraged the patient to switch to unprocessed or minimally processed complex starch and increased protein intake (animal or plant source), fruits, and vegetables.   - Patient is advised to stick to a routine mealtimes to eat 3 meals  a day and avoid unnecessary snacks (to snack only to correct hypoglycemia).  - I have approached her with the following individualized plan to manage her diabetes and patient agrees:   - She is advised to continue her Tresiba 22 units SQ nightly.  She can continue her Mounjaro 15 mg SQ weekly.  I did change the depth of her insulin needles to 8 mm.     -she is encouraged to continue monitoring glucose 4 times daily (using her CGM), before meals and before bed, and to call the clinic if she has readings less than 70 or above 200 for 3 tests in a row.  - she is warned not to take insulin without proper monitoring per orders. - Adjustment parameters are given to her for hypo and hyperglycemia in writing.  -She has been on several other medications for diabetes and did not tolerate them including: Invokana (yeast infections), Afrezza (chronic cough), Victoza (ineffective), Metformin (GI issues).  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  I do not feel this is impacting her diabetes from the available chart notes.  -  Specific targets for  A1c; LDL, HDL, and Triglycerides were discussed with the patient.  2) Blood Pressure /Hypertension:  her blood pressure is controlled to target.   she is advised to continue her current medications including Benicar 40 mg p.o. daily with breakfast and Lasix 20 mg po daily.  3) Lipids/Hyperlipidemia:    Review of her recent lipid panel from 03/21/23 showed controlled LDL at 36 and elevated triglycerides of 233 (worsening slightly) .  she is advised to continue Nexlizet 180-10 mg mg daily at bedtime and Crestor 20 mg po daily, and Vascepa 2 grams twice daily.  Side effects and precautions discussed with her.  4)  Weight/Diet:  her Body mass index is 39.64 kg/m.  -  clearly complicating her diabetes care.   she is a candidate for weight loss. I discussed with her the fact that loss of 5 - 10% of her   current body weight will have the most impact on her diabetes management.  Exercise, and detailed carbohydrates information provided  -  detailed on discharge instructions.  5) Chronic Care/Health Maintenance: -she is on ACEI/ARB and Statin medications and is encouraged to initiate and continue to follow up with Ophthalmology, Dentist, Podiatrist at least yearly or according to recommendations, and advised to stay away from smoking. I have recommended yearly flu vaccine and pneumonia vaccine at least every 5 years; moderate intensity exercise for up to 150 minutes weekly; and sleep for at least 7 hours a day.  6) Hypercalcemia Recent CMP shows high calcium of 10.3.  It has been mildly elevated for several years.  She does have vitamin D deficiency, is on replacement with Ergocalciferol.  She does have history of kidney stones.  She does not take any calcium supplements, no Tums.  She is prescribed hydrochlorothiazide but only takes it as needed for fluid (she cannot remember the last time she needed it).  She never has had bone density exam.  She does have family history of thyroid cancer (brother), no known personal or family history of parathyroid problems (although her brother had previous workup for high calcium as well).  Will perform additional workup for possible parathyroid adenoma.  Will call her with the results and next steps.  - she is advised to maintain close follow up with Benita Stabile, MD for primary care needs, as well as her other providers for optimal and coordinated care.      I spent  48  minutes in the care of the patient today including review of labs from CMP, Lipids, Thyroid Function, Hematology (current and previous including abstractions from other facilities); face-to-face time discussing  her blood glucose readings/logs, discussing hypoglycemia and hyperglycemia episodes and symptoms, medications doses, her options of short and long term treatment based on the latest  standards of care / guidelines;  discussion about incorporating lifestyle medicine;  and documenting the encounter. Risk reduction counseling performed per USPSTF guidelines to reduce obesity and cardiovascular risk factors.     Please refer to Patient Instructions for Blood Glucose Monitoring and Insulin/Medications Dosing Guide"  in media tab for additional information. Please  also refer to " Patient Self Inventory" in the Media  tab for reviewed elements of pertinent patient history.  Loretta Luna participated in the discussions, expressed understanding, and voiced agreement with the above plans.  All questions were answered to her satisfaction. she is encouraged to contact clinic should she have any questions or concerns prior to her return visit.     Follow  up plan: - Return in about 4 months (around 01/19/2024) for Diabetes F/U with A1c in office, Previsit labs, Bring meter and logs.   Ronny Bacon, [facility] [address] [place], [place] [address] Phone: [phone] Fax: [phone]  09/19/2023, 9:32 AM

## 2023-10-07 ENCOUNTER — Encounter: Payer: Self-pay | Admitting: Nurse Practitioner

## 2023-10-08 NOTE — Progress Notes (Signed)
 SUBJECTIVE: Discussed the use of AI scribe software for clinical note transcription with the patient, who gave verbal consent to proceed.  Chief Complaint: Obesity  Interim History: She is down 5 lbs since her last visit Down 37 lbs TBW loss of 14.6%  Loretta Luna is here to discuss her progress with her obesity treatment plan. She is on the Category 3 Plan and states she is following her eating plan approximately 50 % of the time. She states she is exercising walking for 30 minutes 3-4 times per week. Loretta Luna is a 53 year old female with obesity and type 2 diabetes who presents for follow-up on her obesity treatment plan.  She has been on Mounjaro 15 mg weekly and Tresiba, which has been reduced to 20 units nightly and she plans to decrease to 18 units nightly soon. She has lost 37 pounds and is currently walking 30 minutes three to four times per week. Her BMI has decreased to 38.  She has a history of type 2 diabetes for about 15 years and is on a continuous glucose monitor. Her blood sugar levels have been stable, with occasional spikes related to dietary choices. She is currently on Vascepa 2 grams twice daily, Nexlizet 180-10 mg daily, Crestor 20 mg daily for hyperlipidemia, and olmesartan 20 mg daily for blood pressure management.  She experiences constipation, which she attributes to her medication regimen, particularly Mounjaro. We discuss taking psyllium husk capsules and Miralax to manage this. She drinks plenty of water daily and at this point, mostly drinks only water.   She has a history of urinary tract infections and kidney stones, with a recent episode of UTI and E. coli infection treated with Cipro 500 mg twice daily. A CT scan in October showed a 2 mm calculus in the upper pole of the left kidney, ? which may be contributing to recurrent infections.  She is on ergocalciferol 50,000 units weekly for vitamin D management. She is concerned about potential calcium loss from her  bones and is undergoing additional lab work to monitor this.  OBJECTIVE: Visit Diagnoses: Problem List Items Addressed This Visit     Type 2 diabetes mellitus with obesity ([name]) - Primary   Hyperlipidemia associated with type 2 diabetes mellitus ([name])   Hypertension associated with diabetes ([name])   Vitamin D deficiency   Relevant Medications   Vitamin D, Ergocalciferol, (DRISDOL) 1.25 MG (50000 UNIT) CAPS capsule   Obesity ([name])- Starting BMI 45.06/DATE 07/12/22   Drug-induced constipation   Other Visit Diagnoses       UTI with history of kidney stones         BMI 38.0-38.9,adult Current BMI 38.4         Obesity She is undergoing treatment for obesity with Mounjaro 15 mg weekly, resulting in a 37-pound weight loss and a BMI of 38. There is a reduction in adipose tissue and visceral adipose rating, decreasing her risk for diabetes-related complications. She maintains muscle mass while losing adipose tissue, which is the desired outcome. - Continue Mounjaro 15 mg weekly - Encourage continuation of current exercise regimen: walking 30 minutes 3-4 times per week - Encourage dietary adjustments as discussed, including use of [employer] shakes and sugarless cookies in moderation  Type 2 Diabetes Mellitus She has type 2 diabetes managed with Tresiba insulin and Mounjaro. Blood glucose levels have improved, with most readings below 180 mg/dL. She is gradually reducing [place] dosage as glucose levels stabilize. The potential reintroduction of metformin was discussed  to manage constipation and diabetes, despite previous adverse effects. - Continue Tresiba insulin, reducing dosage as blood glucose levels stabilize - Monitor blood glucose levels regularly - Follow up with endocrinologist as scheduled - Discuss potential reintroduction of metformin with endocrinologist to manage constipation and diabetes  Constipation She experiences constipation, likely exacerbated by Mounjaro. She uses  Dulcolax for relief but seeks a healthier long-term solution. Options discussed include psyllium husk capsules, Miralax, and Senna tablets, with emphasis on adequate fluid intake. Metformin's potential to alleviate constipation was considered, despite previous adverse effects. - Start psyllium husk capsules, beginning with 2 nightly and increasing to 4 as tolerated - Consider Miralax (Clearlax) 1 capful daily, increasing to 2 if needed - Ensure fluid intake of 80-100 ounces daily - Consider Senna tablets for immediate relief if necessary  Urinary Tract Infection (UTI) and Kidney Stones She was recently treated with Cipro for a UTI. She has a 2 mm calculus in the upper pole of the left kidney, which may cause recurrent infections. Her diabetes increases her risk for UTIs. Monitoring blood sugar levels for infection indicators was advised, with potential urological intervention if recurrent UTIs occur. - Monitor blood sugar levels for signs of infection - Consider follow-up with urologist if blood sugar levels rise or if recurrent UTIs occur  Hypertension She is on olmesartan 20 mg daily for blood pressure management. - Continue olmesartan 20 mg daily  Hyperlipidemia She is on Crestor 20 mg daily and Vascepa 2 grams twice daily for cholesterol management. Her triglycerides require further reduction. - Continue Crestor 20 mg daily - Continue Vascepa 2 grams twice daily - Monitor cholesterol levels  Vitamin D Deficiency She is on ergocalciferol 50,000 units weekly for vitamin D management. Her levels were last checked in December and are maintained with current supplementation. - Continue ergocalciferol 50,000 units weekly Meds ordered this encounter  Medications   Vitamin D, Ergocalciferol, (DRISDOL) 1.25 MG (50000 UNIT) CAPS capsule    Sig: Take 1 capsule (50,000 Units total) by mouth every 7 (seven) days.    Dispense:  12 capsule    Refill:  0    Follow-up She has follow-up  appointments scheduled for May 7th and June 4th. - Attend follow-up appointment on May 7th - Schedule and attend follow-up appointment on June 4th  Vitals Temp: 97.6 F (36.4 C) BP: 110/73 Pulse Rate: 77 SpO2: 97 %   Anthropometric Measurements Height: 5\' 3"  (1.6 m) Weight: 217 lb (98.4 kg) BMI (Calculated): 38.45 Weight at Last Visit: 222 lb Weight Lost Since Last Visit: 5 lb Weight Gained Since Last Visit: 0 Starting Weight: 254 lb Total Weight Loss (lbs): 37 lb (16.8 kg) Peak Weight: 289 lb   Body Composition  Body Fat %: 44.9 % Fat Mass (lbs): 97.4 lbs Muscle Mass (lbs): 113.6 lbs Total Body Water (lbs): 79.4 lbs Visceral Fat Rating : 13   Other Clinical Data Fasting: yes Labs: no Today's Visit #: 19 Starting Date: 07/12/22     ASSESSMENT AND PLAN:  Diet: Loretta Luna is currently in the action stage of change. As such, her goal is to continue with weight loss efforts. She has agreed to Category 3 Plan.  Exercise: Loretta Luna has been instructed to work up to a goal of 150 minutes of combined cardio and strengthening exercise per week for weight loss and overall health benefits.   Behavior Modification:  We discussed the following Behavioral Modification Strategies today: increasing lean protein intake, decreasing simple carbohydrates, increasing vegetables, increase H2O intake, increase  high fiber foods, no skipping meals, avoiding temptations, and planning for success. We discussed various medication options to help Loretta Luna with her weight loss efforts and we both agreed to continue current medications and continue to work on nutritional and behavioral strategies to promote weight loss.  .  Return in about 4 weeks (around 11/06/2023).Aaron Aas She was informed of the importance of frequent follow up visits to maximize her success with intensive lifestyle modifications for her multiple health conditions.  Attestation Statements:   Reviewed by clinician on day of visit:  allergies, medications, problem list, medical history, surgical history, family history, social history, and previous encounter notes.   Time spent on visit including pre-visit chart review and post-visit care and charting was 48 minutes.    Loretta Gidney, PA-C

## 2023-10-09 ENCOUNTER — Ambulatory Visit (INDEPENDENT_AMBULATORY_CARE_PROVIDER_SITE_OTHER): Payer: BC Managed Care – PPO | Admitting: Physician Assistant

## 2023-10-09 ENCOUNTER — Encounter (INDEPENDENT_AMBULATORY_CARE_PROVIDER_SITE_OTHER): Payer: Self-pay | Admitting: Physician Assistant

## 2023-10-09 VITALS — BP 110/73 | HR 77 | Temp 97.6°F | Ht 63.0 in | Wt 217.0 lb

## 2023-10-09 DIAGNOSIS — E119 Type 2 diabetes mellitus without complications: Secondary | ICD-10-CM

## 2023-10-09 DIAGNOSIS — K5903 Drug induced constipation: Secondary | ICD-10-CM

## 2023-10-09 DIAGNOSIS — M8620 Subacute osteomyelitis, unspecified site: Secondary | ICD-10-CM | POA: Diagnosis not present

## 2023-10-09 DIAGNOSIS — E1169 Type 2 diabetes mellitus with other specified complication: Secondary | ICD-10-CM

## 2023-10-09 DIAGNOSIS — E559 Vitamin D deficiency, unspecified: Secondary | ICD-10-CM | POA: Diagnosis not present

## 2023-10-09 DIAGNOSIS — E785 Hyperlipidemia, unspecified: Secondary | ICD-10-CM

## 2023-10-09 DIAGNOSIS — Z7985 Long-term (current) use of injectable non-insulin antidiabetic drugs: Secondary | ICD-10-CM

## 2023-10-09 DIAGNOSIS — E1159 Type 2 diabetes mellitus with other circulatory complications: Secondary | ICD-10-CM | POA: Diagnosis not present

## 2023-10-09 DIAGNOSIS — E669 Obesity, unspecified: Secondary | ICD-10-CM

## 2023-10-09 DIAGNOSIS — Z87442 Personal history of urinary calculi: Secondary | ICD-10-CM

## 2023-10-09 DIAGNOSIS — I152 Hypertension secondary to endocrine disorders: Secondary | ICD-10-CM

## 2023-10-09 DIAGNOSIS — Z6838 Body mass index (BMI) 38.0-38.9, adult: Secondary | ICD-10-CM

## 2023-10-09 DIAGNOSIS — N3000 Acute cystitis without hematuria: Secondary | ICD-10-CM

## 2023-10-09 DIAGNOSIS — N390 Urinary tract infection, site not specified: Secondary | ICD-10-CM

## 2023-10-09 MED ORDER — VITAMIN D (ERGOCALCIFEROL) 1.25 MG (50000 UNIT) PO CAPS: 50000.0000 [IU] | ORAL_CAPSULE | ORAL | 0 refills | Status: DC

## 2023-10-19 LAB — COMPREHENSIVE METABOLIC PANEL WITH GFR
ALT: 34 IU/L — ABNORMAL HIGH (ref 0–32)
AST: 35 IU/L (ref 0–40)
Albumin: 4.7 g/dL (ref 3.8–4.9)
Alkaline Phosphatase: 90 IU/L (ref 44–121)
BUN/Creatinine Ratio: 19 (ref 9–23)
BUN: 13 mg/dL (ref 6–24)
Bilirubin Total: 0.4 mg/dL (ref 0.0–1.2)
CO2: 25 mmol/L (ref 20–29)
Calcium: 10.4 mg/dL — ABNORMAL HIGH (ref 8.7–10.2)
Chloride: 102 mmol/L (ref 96–106)
Creatinine, Ser: 0.68 mg/dL (ref 0.57–1.00)
Globulin, Total: 2.2 g/dL (ref 1.5–4.5)
Glucose: 157 mg/dL — ABNORMAL HIGH (ref 70–99)
Potassium: 4.1 mmol/L (ref 3.5–5.2)
Sodium: 142 mmol/L (ref 134–144)
Total Protein: 6.9 g/dL (ref 6.0–8.5)
eGFR: 103 mL/min/{1.73_m2} (ref >59)

## 2023-10-19 LAB — PTH, INTACT AND CALCIUM: PTH: 23 pg/mL (ref 15–65)

## 2023-10-19 LAB — MAGNESIUM: Magnesium: 2.1 mg/dL (ref 1.6–2.3)

## 2023-10-19 LAB — PTH-RELATED PEPTIDE: PTH-related peptide: <2 pmol/L

## 2023-10-19 LAB — PHOSPHORUS: Phosphorus: 3.9 mg/dL (ref 3.0–4.3)

## 2023-10-19 LAB — VITAMIN D 25 HYDROXY (VIT D DEFICIENCY, FRACTURES): Vit D, 25-Hydroxy: 37.3 ng/mL (ref 30.0–100.0)

## 2023-10-21 ENCOUNTER — Encounter: Payer: Self-pay | Admitting: Nurse Practitioner

## 2023-11-06 ENCOUNTER — Ambulatory Visit (INDEPENDENT_AMBULATORY_CARE_PROVIDER_SITE_OTHER): Admitting: Physician Assistant

## 2023-12-03 NOTE — Progress Notes (Unsigned)
 SUBJECTIVE: Discussed the use of AI scribe software for clinical note transcription with the patient, who gave verbal consent to proceed.  Chief Complaint: Obesity  Interim History: She is down 1 lb since her last visit. Down 38 lbs overall TBW loss of ~ 15%  Amsi is here to discuss her progress with her obesity treatment plan. She is on the Category 3 Plan and states she is following her eating plan approximately 25 % of the time. She states she is currently walking to exercise 25-30 minutes 3-4 times per week.  Loretta Luna is a 53 year old female with obesity, type 2 diabetes, and hypertension who presents for follow-up of her obesity treatment plan.  In May, she faced challenges due to a respiratory illness lasting about a week and a half, during which she was on antibiotics. Concurrently, she attended three graduation parties, leading to dietary indiscretions and feeling unwell. Despite these challenges, her weight has remained stable.  Her blood sugar levels have been fluctuating, with readings between 130 and over 200 mg/dL during her illness and lack of physical activity. Since resuming walking, her blood sugar has improved, ranging from the 120s to not exceeding 180 mg/dL. She is currently taking 18 units of Tresiba  daily in addition to Mounjaro  15 mg weekly. Her blood pressure is well-controlled.  She continues to experience urinary symptoms, including droplets of blood when urinating. Previous blood work indicated high calcium levels, which is being monitored. Her kidney function is reportedly good.  She experienced foot swelling due to an ant bite and a tick bite, which caused an infection and itching that lasted about three weeks, but is now improved.  She feels fatigued but notes some recent improvement. She is concerned about her energy levels and mentions a family history of thyroid  cancer, prompting interest in checking her thyroid  function. She inquires about the impact of  PCOS on her estrogen levels and weight loss, noting that she may have already gone through menopause. She is not currently seeing a gynecologist but plans to follow up with one.  She works extensively, often feeling stressed/unappreciated at her job. She has been with her current employer for nine years and is considering some changes for better work /life balance.   Fasting labs obtained today.  The patient was informed we would discuss the lab results at the next visit unless there is a critical issue that needs to be addressed sooner. The patient agreed to keep the next visit at the agreed upon time to discuss these results.   OBJECTIVE: Visit Diagnoses: Problem List Items Addressed This Visit     Other fatigue   Relevant Orders   Vitamin B12   CBC with Differential/Platelet   TSH   T4, free   T3   Type 2 diabetes mellitus with obesity ([name]) - Primary   Relevant Orders   Hemoglobin A1c   CMP14+EGFR   Hyperlipidemia associated with type 2 diabetes mellitus ([name])   Relevant Orders   Lipid Panel With LDL/HDL Ratio   Hypertension associated with diabetes ([name])   Vitamin D  deficiency   Relevant Medications   Vitamin D , Ergocalciferol , (DRISDOL ) 1.25 MG (50000 UNIT) CAPS capsule   Other Relevant Orders   VITAMIN D  25 Hydroxy (Vit-D Deficiency, Fractures)   Obesity ([name])- Starting BMI 45.06/DATE 07/12/22   Other Visit Diagnoses       UTI with history of kidney stones         BMI 38.0-38.9,adult Current BMI 38.4  Obesity She has maintained a 30-pound weight loss but reported dietary indiscretions during social events, causing gastrointestinal discomfort. She acknowledges the impact of dietary choices on her health and remains motivated to continue weight management. Physical activity positively impacts her weight and glucose levels. - Continue Mounjaro  15 mg weekly - Encourage regular physical activity   Type 2 Diabetes Mellitus Her blood glucose levels fluctuate  between 130 and over 200 mg/dL. Increased physical activity improves glucose levels. She is on Mounjaro  15 mg weekly and Tresiba  18 units daily. Recent illness and lack of physical activity contributed to higher glucose levels. She is following regularly with Endocrinology- Hulon Magic, NP- Appt 7/24 Lab Results  Component Value Date   HGBA1C 7.0 (H) 06/12/2023   HGBA1C 6.6 (A) 05/21/2023   HGBA1C 6.0 (A) 01/16/2023   Lab Results  Component Value Date   LDLCALC 45 06/12/2023   CREATININE 0.68 10/09/2023   She is working  on nutrition plan to decrease simple carbohydrates, increase lean proteins and exercise to promote weight loss and improve glycemic control . Continue Mounjaro  15 mg weekly.  - Continue Tresiba  18 units daily - Encourage regular physical activity - Order fasting blood work including A1c and insulin  levels as well as CMET today,.   PCOS (Polycystic Ovary Syndrome) PCOS is associated with obesity and dyslipidemia, affecting weight management. She inquired about estrogen levels' impact on weight loss. - Discuss PCOS impact on weight management and metabolic health - Encourage follow-up with a gynecologist for hormone evaluation  Hypertension Blood pressure is well-controlled on olmesartan  20 mg daily, with hydrochlorothiazide and Lasix as needed for volume management. BP Readings from Last 3 Encounters:  12/04/23 115/76  10/09/23 110/73  09/19/23 110/74   Continue to work on nutrition plan to promote weight loss and improve BP control.   - Continue olmesartan  20 mg daily - Use hydrochlorothiazide and Lasix as needed  Hyperlipidemia She is on rosuvastatin, Vascepa, and bempedoic acid/ezetimibe. A fasting lipid panel is planned to assess current lipid levels. Lab Results  Component Value Date   CHOL 129 06/12/2023   CHOL 134 07/12/2022   CHOL 210 (H) 03/28/2007   Lab Results  Component Value Date   HDL 43 06/12/2023   HDL 43 07/12/2022   HDL 45  03/28/2007   Lab Results  Component Value Date   LDLCALC 45 06/12/2023   LDLCALC 36 07/12/2022   LDLCALC See Comment mg/dL 98/05/9146   Lab Results  Component Value Date   TRIG 263 (H) 06/12/2023   TRIG 381 (H) 07/12/2022   TRIG 512 (H) 03/28/2007   Lab Results  Component Value Date   CHOLHDL 4.7 Ratio 03/28/2007   CHOLHDL 4.1 07/01/2006   No results found for: "LDLDIRECT" Continue to work on nutrition plan -decreasing simple carbohydrates, increasing lean proteins, decreasing saturated fats and cholesterol , avoiding trans fats and exercise as able to promote weight loss, improve lipids and decrease cardiovascular risks. - Continue rosuvastatin 20 mg daily - Continue Vascepa 2 grams twice daily - Continue bempedoic acid/ezetimibe 80/10 mg daily - Order fasting lipid panel  Fatigue She reports fatigue and concerns about thyroid  function, with a family history of thyroid  cancer. She is on thyroid  medication, and thyroid  function tests are planned. Further evaluation is needed. - Order TSH, T3, and T4 in addition to B 12 and vitamin D  levels and CBC - Monitor for symptoms of hypothyroidism  Vitamin D  Deficiency She is on ergocalciferol  50,000 units weekly and is unsure of her  current supply, possibly needing a refill. - Continue ergocalciferol  50,000 units weekly - Check vitamin D  levels - Refill ergocalciferol  prescription if needed Meds ordered this encounter  Medications   Vitamin D , Ergocalciferol , (DRISDOL ) 1.25 MG (50000 UNIT) CAPS capsule    Sig: Take 1 capsule (50,000 Units total) by mouth every 7 (seven) days.    Dispense:  12 capsule    Refill:  0    History of kidney stones/Urinary Tract Infection She has a recent urinary tract infection and reports intermittent hematuria. Previous blood work indicated high calcium levels, which are being monitored. Kidney function remains good. Endo following - Order blood work to check calcium levels and kidney  function  General Health Maintenance She is concerned about overall health, energy levels, and work-related stress. She is considering changes for better work-life balance. - Encourage stress management techniques and work-life balance   Follow-up She is scheduled to see a new nurse practitioner with Dr. Del Favia on the 19th and requires updated blood work for this appointment. - Perform blood work including CBC, C-Met, B12, calcium, and thyroid  panel - Schedule follow-up appointment after lab results are available  Vitals Temp: 98.2 F (36.8 C) BP: 115/76 Pulse Rate: 74 SpO2: 97 %   Anthropometric Measurements Height: 5\' 3"  (1.6 m) Weight: 216 lb (98 kg) BMI (Calculated): 38.27 Weight at Last Visit: 217 lb Weight Lost Since Last Visit: 1 lb Weight Gained Since Last Visit: 0 Starting Weight: 254 lb Total Weight Loss (lbs): 38 lb (17.2 kg) Peak Weight: 289 lb   Body Composition  Body Fat %: 45 % Fat Mass (lbs): 97.6 lbs Muscle Mass (lbs): 113.2 lbs Total Body Water (lbs): 78.8 lbs Visceral Fat Rating : 13   Other Clinical Data Fasting: Yes Labs: No Today's Visit #: 20 Starting Date: 07/12/22     ASSESSMENT AND PLAN:  Diet: Shakyia is currently in the action stage of change. As such, her goal is to continue with weight loss efforts. She has agreed to Category 3 Plan.  Exercise: Jadah has been instructed to work up to a goal of 150 minutes of combined cardio and strengthening exercise per week and to continue exercising as is for weight loss and overall health benefits.   Behavior Modification:  We discussed the following Behavioral Modification Strategies today: increasing lean protein intake, decreasing simple carbohydrates, increasing vegetables, increase H2O intake, increase high fiber foods, no skipping meals, meal planning and cooking strategies, avoiding temptations, and planning for success. We discussed various medication options to help Jestine with her  weight loss efforts and we both agreed to continue current treatment plan , continue to work on nutritional and behavioral strategies to promote weight loss.  .  Return in about 8 weeks (around 01/29/2024).Aaron Aas She was informed of the importance of frequent follow up visits to maximize her success with intensive lifestyle modifications for her multiple health conditions.  Attestation Statements:   Reviewed by clinician on day of visit: allergies, medications, problem list, medical history, surgical history, family history, social history, and previous encounter notes.   Time spent on visit including pre-visit chart review and post-visit care and charting was 32 minutes.    Wolfe Camarena, PA-C

## 2023-12-04 ENCOUNTER — Ambulatory Visit (INDEPENDENT_AMBULATORY_CARE_PROVIDER_SITE_OTHER): Admitting: Physician Assistant

## 2023-12-04 ENCOUNTER — Encounter (INDEPENDENT_AMBULATORY_CARE_PROVIDER_SITE_OTHER): Payer: Self-pay | Admitting: Physician Assistant

## 2023-12-04 VITALS — BP 115/76 | HR 74 | Temp 98.2°F | Ht 63.0 in | Wt 216.0 lb

## 2023-12-04 DIAGNOSIS — E785 Hyperlipidemia, unspecified: Secondary | ICD-10-CM | POA: Diagnosis not present

## 2023-12-04 DIAGNOSIS — I152 Hypertension secondary to endocrine disorders: Secondary | ICD-10-CM | POA: Diagnosis not present

## 2023-12-04 DIAGNOSIS — E559 Vitamin D deficiency, unspecified: Secondary | ICD-10-CM

## 2023-12-04 DIAGNOSIS — E669 Obesity, unspecified: Secondary | ICD-10-CM | POA: Diagnosis not present

## 2023-12-04 DIAGNOSIS — Z7985 Long-term (current) use of injectable non-insulin antidiabetic drugs: Secondary | ICD-10-CM

## 2023-12-04 DIAGNOSIS — Z6838 Body mass index (BMI) 38.0-38.9, adult: Secondary | ICD-10-CM

## 2023-12-04 DIAGNOSIS — E1169 Type 2 diabetes mellitus with other specified complication: Secondary | ICD-10-CM | POA: Diagnosis not present

## 2023-12-04 DIAGNOSIS — Z794 Long term (current) use of insulin: Secondary | ICD-10-CM

## 2023-12-04 DIAGNOSIS — R5383 Other fatigue: Secondary | ICD-10-CM

## 2023-12-04 DIAGNOSIS — E1159 Type 2 diabetes mellitus with other circulatory complications: Secondary | ICD-10-CM | POA: Diagnosis not present

## 2023-12-04 DIAGNOSIS — N3000 Acute cystitis without hematuria: Secondary | ICD-10-CM

## 2023-12-04 DIAGNOSIS — K5903 Drug induced constipation: Secondary | ICD-10-CM

## 2023-12-04 MED ORDER — VITAMIN D (ERGOCALCIFEROL) 1.25 MG (50000 UNIT) PO CAPS: 50000.0000 [IU] | ORAL_CAPSULE | ORAL | 0 refills | Status: DC

## 2023-12-05 LAB — CBC WITH DIFFERENTIAL/PLATELET
Basophils Absolute: 0.1 10*3/uL (ref 0.0–0.2)
Basos: 1 %
EOS (ABSOLUTE): 0.2 10*3/uL (ref 0.0–0.4)
Eos: 4 %
Hematocrit: 48 % — ABNORMAL HIGH (ref 34.0–46.6)
Hemoglobin: 15.8 g/dL (ref 11.1–15.9)
Immature Grans (Abs): 0 10*3/uL (ref 0.0–0.1)
Immature Granulocytes: 0 %
Lymphocytes Absolute: 1.5 10*3/uL (ref 0.7–3.1)
Lymphs: 21 %
MCH: 28.9 pg (ref 26.6–33.0)
MCHC: 32.9 g/dL (ref 31.5–35.7)
MCV: 88 fL (ref 79–97)
Monocytes Absolute: 0.4 10*3/uL (ref 0.1–0.9)
Monocytes: 6 %
Neutrophils Absolute: 4.7 10*3/uL (ref 1.4–7.0)
Neutrophils: 68 %
Platelets: 295 10*3/uL (ref 150–450)
RBC: 5.46 x10E6/uL — ABNORMAL HIGH (ref 3.77–5.28)
RDW: 13 % (ref 11.7–15.4)
WBC: 6.9 10*3/uL (ref 3.4–10.8)

## 2023-12-05 LAB — TSH: TSH: 3.78 u[IU]/mL (ref 0.450–4.500)

## 2023-12-05 LAB — CMP14+EGFR
ALT: 34 IU/L — ABNORMAL HIGH (ref 0–32)
AST: 27 IU/L (ref 0–40)
Albumin: 4.7 g/dL (ref 3.8–4.9)
Alkaline Phosphatase: 95 IU/L (ref 44–121)
BUN/Creatinine Ratio: 23 (ref 9–23)
BUN: 19 mg/dL (ref 6–24)
Bilirubin Total: 0.4 mg/dL (ref 0.0–1.2)
CO2: 23 mmol/L (ref 20–29)
Calcium: 10.3 mg/dL — ABNORMAL HIGH (ref 8.7–10.2)
Chloride: 102 mmol/L (ref 96–106)
Creatinine, Ser: 0.83 mg/dL (ref 0.57–1.00)
Globulin, Total: 2.2 g/dL (ref 1.5–4.5)
Glucose: 114 mg/dL — ABNORMAL HIGH (ref 70–99)
Potassium: 4.1 mmol/L (ref 3.5–5.2)
Sodium: 140 mmol/L (ref 134–144)
Total Protein: 6.9 g/dL (ref 6.0–8.5)
eGFR: 84 mL/min/{1.73_m2} (ref >59)

## 2023-12-05 LAB — LIPID PANEL WITH LDL/HDL RATIO
Cholesterol, Total: 104 mg/dL (ref 100–199)
HDL: 44 mg/dL (ref >39)
LDL Chol Calc (NIH): 21 mg/dL (ref 0–99)
LDL/HDL Ratio: 0.5 ratio (ref 0.0–3.2)
Triglycerides: 264 mg/dL — ABNORMAL HIGH (ref 0–149)
VLDL Cholesterol Cal: 39 mg/dL (ref 5–40)

## 2023-12-05 LAB — T4, FREE: Free T4: 1.09 ng/dL (ref 0.82–1.77)

## 2023-12-05 LAB — T3: T3, Total: 87 ng/dL (ref 71–180)

## 2023-12-05 LAB — VITAMIN B12: Vitamin B-12: 847 pg/mL (ref 232–1245)

## 2023-12-05 LAB — HEMOGLOBIN A1C
Est. average glucose Bld gHb Est-mCnc: 146 mg/dL
Hgb A1c MFr Bld: 6.7 % — ABNORMAL HIGH (ref 4.8–5.6)

## 2023-12-05 LAB — VITAMIN D 25 HYDROXY (VIT D DEFICIENCY, FRACTURES): Vit D, 25-Hydroxy: 40 ng/mL (ref 30.0–100.0)

## 2023-12-11 DIAGNOSIS — R739 Hyperglycemia, unspecified: Secondary | ICD-10-CM | POA: Diagnosis not present

## 2023-12-11 DIAGNOSIS — I10 Essential (primary) hypertension: Secondary | ICD-10-CM | POA: Diagnosis not present

## 2023-12-11 DIAGNOSIS — E1169 Type 2 diabetes mellitus with other specified complication: Secondary | ICD-10-CM | POA: Diagnosis not present

## 2023-12-11 DIAGNOSIS — E1165 Type 2 diabetes mellitus with hyperglycemia: Secondary | ICD-10-CM | POA: Diagnosis not present

## 2023-12-11 DIAGNOSIS — E7849 Other hyperlipidemia: Secondary | ICD-10-CM | POA: Diagnosis not present

## 2023-12-17 DIAGNOSIS — R739 Hyperglycemia, unspecified: Secondary | ICD-10-CM | POA: Diagnosis not present

## 2023-12-19 DIAGNOSIS — E1169 Type 2 diabetes mellitus with other specified complication: Secondary | ICD-10-CM | POA: Diagnosis not present

## 2023-12-19 DIAGNOSIS — E7849 Other hyperlipidemia: Secondary | ICD-10-CM | POA: Diagnosis not present

## 2023-12-19 DIAGNOSIS — I10 Essential (primary) hypertension: Secondary | ICD-10-CM | POA: Diagnosis not present

## 2023-12-19 DIAGNOSIS — E876 Hypokalemia: Secondary | ICD-10-CM | POA: Diagnosis not present

## 2023-12-23 ENCOUNTER — Other Ambulatory Visit (HOSPITAL_COMMUNITY): Payer: Self-pay | Admitting: Nurse Practitioner

## 2023-12-23 DIAGNOSIS — R413 Other amnesia: Secondary | ICD-10-CM

## 2023-12-24 ENCOUNTER — Other Ambulatory Visit (HOSPITAL_COMMUNITY): Payer: Self-pay | Admitting: Internal Medicine

## 2023-12-24 DIAGNOSIS — Z1231 Encounter for screening mammogram for malignant neoplasm of breast: Secondary | ICD-10-CM

## 2023-12-24 DIAGNOSIS — R5383 Other fatigue: Secondary | ICD-10-CM | POA: Diagnosis not present

## 2023-12-24 DIAGNOSIS — R413 Other amnesia: Secondary | ICD-10-CM | POA: Diagnosis not present

## 2023-12-25 ENCOUNTER — Other Ambulatory Visit: Payer: Self-pay | Admitting: Nurse Practitioner

## 2023-12-25 DIAGNOSIS — E1169 Type 2 diabetes mellitus with other specified complication: Secondary | ICD-10-CM

## 2023-12-26 ENCOUNTER — Encounter: Payer: Self-pay | Admitting: Nurse Practitioner

## 2023-12-30 ENCOUNTER — Other Ambulatory Visit: Payer: Self-pay | Admitting: Nurse Practitioner

## 2024-01-06 ENCOUNTER — Encounter (HOSPITAL_COMMUNITY): Payer: Self-pay

## 2024-01-06 ENCOUNTER — Ambulatory Visit (HOSPITAL_COMMUNITY)
Admission: RE | Admit: 2024-01-06 | Discharge: 2024-01-06 | Disposition: A | Discharge status: Home / Self Care | Source: Ambulatory Visit | Attending: Internal Medicine | Admitting: Internal Medicine

## 2024-01-06 DIAGNOSIS — Z1231 Encounter for screening mammogram for malignant neoplasm of breast: Secondary | ICD-10-CM | POA: Insufficient documentation

## 2024-01-23 ENCOUNTER — Encounter: Payer: Self-pay | Admitting: Nurse Practitioner

## 2024-01-23 ENCOUNTER — Ambulatory Visit: Admitting: Nurse Practitioner

## 2024-01-23 VITALS — BP 114/70 | HR 79 | Ht 63.0 in | Wt 223.2 lb

## 2024-01-23 DIAGNOSIS — E119 Type 2 diabetes mellitus without complications: Secondary | ICD-10-CM | POA: Diagnosis not present

## 2024-01-23 DIAGNOSIS — Z794 Long term (current) use of insulin: Secondary | ICD-10-CM

## 2024-01-23 DIAGNOSIS — Z7985 Long-term (current) use of injectable non-insulin antidiabetic drugs: Secondary | ICD-10-CM | POA: Diagnosis not present

## 2024-01-23 MED ORDER — METFORMIN HCL ER 500 MG PO TB24
500.0000 mg | ORAL_TABLET | Freq: Every day | ORAL | 1 refills | Status: DC
Start: 2024-01-23 — End: 2024-03-18

## 2024-01-23 NOTE — Progress Notes (Signed)
 Endocrinology Follow Up Note       01/23/2024, 9:43 AM   Subjective:    Patient ID: Loretta Luna, female    DOB: 08/17/1970.  Marye Eagen is being seen in consultation for management of currently uncontrolled symptomatic diabetes requested by  Shona Norleen PEDLAR, MD.  She also sees [facility] in [place].   Past Medical History:  Diagnosis Date   Anxiety    Asthma    Back pain    Chest pain    Constipation    Depression    Diabetes ([name])    Essential hypertension    Fatty liver    GERD (gastroesophageal reflux disease)    History of swelling of feet    Hypercalcemia    Hyperglycemia    Hypokalemia    IBS (irritable bowel syndrome)    Infertility, female    Insomnia    Joint pain    Mixed hyperlipidemia    Obesity    Pancreatic cyst    Polycystic ovarian syndrome    Proteinuria    Systolic murmur    Type 2 diabetes mellitus ([name])     Past Surgical History:  Procedure Laterality Date   CHOLECYSTECTOMY      Social History   Socioeconomic History   Marital status: Single    Spouse name: Not on file   Number of children: Not on file   Years of education: Not on file   Highest education level: Not on file  Occupational History   Not on file  Tobacco Use   Smoking status: Never   Smokeless tobacco: Never  Substance and Sexual Activity   Alcohol use: No    Alcohol/week: 0.0 standard drinks of alcohol   Drug use: Never   Sexual activity: Not Currently  Other Topics Concern   Not on file  Social History Narrative   Not on file   Social Drivers of Health   Financial Resource Strain: Not on file  Food Insecurity: Not on file  Transportation Needs: Not on file  Physical Activity: Insufficiently Active (05/05/2020)   Received from [facility]   Exercise Vital Sign    On average, how many days per week do you engage in moderate to strenuous exercise (like a brisk walk)?: 3 days     On average, how many minutes do you engage in exercise at this level?: 30 min  Stress: Not on file  Social Connections: Not on file    Family History  Problem Relation Age of Onset   COPD Mother    Hyperlipidemia Mother    Hypertension Mother    Stroke Mother    Diabetes Mother    Heart disease Mother    Obesity Mother    Hyperlipidemia Father    Hypertension Father    Stroke Father    Diabetes Father    Sleep apnea Father    Obesity Father    Hypertension Sister    Asthma Brother    Hyperlipidemia Brother    Hypertension Brother    Cancer Maternal Grandmother    COPD Maternal Grandfather    Hyperlipidemia Maternal Grandfather    Cancer Paternal Grandmother  Stroke Paternal Grandfather     Outpatient Encounter Medications as of 01/23/2024  Medication Sig   Bempedoic Acid-Ezetimibe (NEXLIZET) 180-10 MG TABS Take by mouth.   Continuous Glucose Sensor (DEXCOM G7 SENSOR) MISC Change sensor every 10 days as directed.   cyanocobalamin  (VITAMIN B12) 500 MCG tablet Take 1 tablet (500 mcg total) by mouth daily.   furosemide (LASIX) 20 MG tablet Take 20 mg by mouth as needed.   HYDROCHLOROTHIAZIDE PO Take by mouth as needed.   icosapent Ethyl (VASCEPA) 1 g capsule Take 2 g by mouth 2 (two) times daily.   Insulin  Pen Needle (PEN NEEDLES) 31G X 8 MM MISC Use to inject insulin  once daily   loratadine (CLARITIN) 10 MG tablet Take 10 mg by mouth daily.   Magnesium  250 MG TABS May use CALM supplement- 325mg  mag 1-twice daily (Patient taking differently: May use CALM supplement- 325mg  mag 2 pills -twice daily)   metFORMIN  (GLUCOPHAGE -XR) 500 MG 24 hr tablet Take 1 tablet (500 mg total) by mouth daily with breakfast.   MOUNJARO  15 MG/0.5ML Pen Inject 15 mg into the skin once a week.   olmesartan  (BENICAR ) 20 MG tablet Take 1 tablet (20 mg total) by mouth daily.   potassium chloride  (KLOR-CON ) 10 MEQ tablet Take 10 mEq by mouth 2 (two) times daily.   rosuvastatin (CRESTOR) 20 MG  tablet Take 20 mg by mouth daily.   Vitamin D , Ergocalciferol , (DRISDOL ) 1.25 MG (50000 UNIT) CAPS capsule Take 1 capsule (50,000 Units total) by mouth every 7 (seven) days.   TRESIBA  FLEXTOUCH 200 UNIT/ML FlexTouch Pen Inject 60 Units into the skin at bedtime.   No facility-administered encounter medications on file as of 01/23/2024.    ALLERGIES: Allergies  Allergen Reactions   Iodinated Contrast Media Hives and Swelling    Ok with 13 hour premed   Food     shellfish   Iodine     REACTION: swelling, hives, tachypnea   Metformin  And Related Diarrhea    VACCINATION STATUS: Immunization History  Administered Date(s) Administered   Influenza Whole 04/10/2007    Diabetes She presents for her follow-up diabetic visit. She has type 2 diabetes mellitus. Onset time: diagnosed at approx age of 49. Her disease course has been fluctuating. There are no hypoglycemic associated symptoms. There are no diabetic associated symptoms. There are no diabetic complications. Risk factors for coronary artery disease include diabetes mellitus, dyslipidemia, family history, obesity, hypertension and sedentary lifestyle. Current diabetic treatment includes insulin  injections (and Mounjaro ). She is compliant with treatment most of the time (has had trouble getting Mounjaro  intermittently due to supply issues). Her weight is fluctuating minimally. She is following a generally healthy diet. Meal planning includes ADA exchanges, avoidance of concentrated sweets, carbohydrate counting and calorie counting. She has had a previous visit with a dietitian. She rarely participates in exercise. Her home blood glucose trend is increasing steadily. Her overall blood glucose range is 180-200 mg/dl. (She presents today with her CGM, showing slightly above target glycemic profile.  Her most recent A1c on 6/4 was 6.7%, improving from last visit of 7%.  She denies any significant hypoglycemia.  Analysis of her CGM shows TIR 57%, TAR  43%, TBR 0%.  She has been drinking more Diet Mt Dew recently, has also been dealing with significant constipation.) An ACE inhibitor/angiotensin II receptor blocker is being taken. She sees a podiatrist.Eye exam is current.   Review of systems  Constitutional: + increasing body weight,  current Body mass index is  39.54 kg/m. , no fatigue, no subjective hyperthermia, no subjective hypothermia Eyes: no blurry vision, no xerophthalmia ENT: no sore throat, no nodules palpated in throat, no dysphagia/odynophagia, no hoarseness Cardiovascular: no chest pain, no shortness of breath, no palpitations, no leg swelling Respiratory: no cough, no shortness of breath Gastrointestinal: no nausea/vomiting/diarrhea, + constipation Musculoskeletal: no muscle/joint aches Skin: no rashes, no hyperemia Neurological: no tremors, no numbness, no tingling, no dizziness Psychiatric: no depression, no anxiety  Objective:     BP 114/70 (BP Location: Left Arm, Patient Position: Sitting, Cuff Size: Large)   Pulse 79   Ht 5' 3 (1.6 m)   Wt 223 lb 3.2 oz (101.2 kg)   LMP  (LMP Unknown)   BMI 39.54 kg/m   Wt Readings from Last 3 Encounters:  01/23/24 223 lb 3.2 oz (101.2 kg)  12/04/23 216 lb (98 kg)  10/09/23 217 lb (98.4 kg)     BP Readings from Last 3 Encounters:  01/23/24 114/70  12/04/23 115/76  10/09/23 110/73      Physical Exam- Limited  Constitutional:  Body mass index is 39.54 kg/m. , not in acute distress, normal state of mind Eyes:  EOMI, no exophthalmos Musculoskeletal: no gross deformities, strength intact in all four extremities, no gross restriction of joint movements Skin:  no rashes, no hyperemia Neurological: no tremor with outstretched hands   Diabetic Foot Exam - Simple   Simple Foot Form Diabetic Foot exam was performed with the following findings: Yes 01/23/2024  8:34 AM  Visual Inspection No deformities, no ulcerations, no other skin breakdown bilaterally: Yes Sensation  Testing Intact to touch and monofilament testing bilaterally: Yes Pulse Check Posterior Tibialis and Dorsalis pulse intact bilaterally: Yes Comments Calluses bilaterally    CMP ( most recent) CMP     Component Value Date/Time   NA 140 12/04/2023 0925   K 4.1 12/04/2023 0925   CL 102 12/04/2023 0925   CO2 23 12/04/2023 0925   GLUCOSE 114 (H) 12/04/2023 0925   GLUCOSE 120 (H) 07/15/2012 1058   BUN 19 12/04/2023 0925   CREATININE 0.83 12/04/2023 0925   CREATININE 0.77 07/15/2012 1058   CALCIUM 10.3 (H) 12/04/2023 0925   PROT 6.9 12/04/2023 0925   ALBUMIN 4.7 12/04/2023 0925   AST 27 12/04/2023 0925   ALT 34 (H) 12/04/2023 0925   ALKPHOS 95 12/04/2023 0925   BILITOT 0.4 12/04/2023 0925     Diabetic Labs (most recent): Lab Results  Component Value Date   HGBA1C 6.7 (H) 12/04/2023   HGBA1C 7.0 (H) 06/12/2023   HGBA1C 6.6 (A) 05/21/2023     Lipid Panel ( most recent) Lipid Panel     Component Value Date/Time   CHOL 104 12/04/2023 0925   TRIG 264 (H) 12/04/2023 0925   HDL 44 12/04/2023 0925   CHOLHDL 4.7 Ratio 03/28/2007 2157   VLDL NOT CALC mg/dL 90/73/7991 7842   LDLCALC 21 12/04/2023 0925   LABVLDL 39 12/04/2023 0925      Lab Results  Component Value Date   TSH 3.780 12/04/2023   TSH 3.110 07/12/2022   FREET4 1.09 12/04/2023   FREET4 1.10 07/12/2022           Assessment & Plan:   1) Type 2 diabetes mellitus without complication, with long-term current use of insulin   She presents today with her CGM, showing slightly above target glycemic profile.  Her most recent A1c on 6/4 was 6.7%, improving from last visit of 7%.  She denies any significant hypoglycemia.  Analysis of her CGM shows TIR 57%, TAR 43%, TBR 0%.  She has been drinking more Diet Mt Dew recently, has also been dealing with significant constipation.  - Abel Ra has currently uncontrolled symptomatic type 2 DM since 53 years of age.   -Recent labs reviewed.  - I had a long discussion  with her about the progressive nature of diabetes and the pathology behind its complications. -her diabetes is not currently complicated but she remains at a high risk for more acute and chronic complications which include CAD, CVA, CKD, retinopathy, and neuropathy. These are all discussed in detail with her.  The following Lifestyle Medicine recommendations according to American College of Lifestyle Medicine [facility]) were discussed and offered to patient and she agrees to start the journey:  A. Whole Foods, Plant-based plate comprising of fruits and vegetables, plant-based proteins, whole-grain carbohydrates was discussed in detail with the patient.   A list for source of those nutrients were also provided to the patient.  Patient will use only water or unsweetened tea for hydration. B.  The need to stay away from risky substances including alcohol, smoking; obtaining 7 to 9 hours of restorative sleep, at least 150 minutes of moderate intensity exercise weekly, the importance of healthy social connections,  and stress reduction techniques were discussed. C.  A full color page of  Calorie density of various food groups per pound showing examples of each food groups was provided to the patient.  - Nutritional counseling repeated at each appointment due to patients tendency to fall back in to old habits.  - The patient admits there is a room for improvement in their diet and drink choices. -  Suggestion is made for the patient to avoid simple carbohydrates from their diet including Cakes, Sweet Desserts / Pastries, Ice Cream, Soda (diet and regular), Sweet Tea, Candies, Chips, Cookies, Sweet Pastries, Store Bought Juices, Alcohol in Excess of 1-2 drinks a day, Artificial Sweeteners, Coffee Creamer, and Sugar-free Products. This will help patient to have stable blood glucose profile and potentially avoid unintended weight gain.   - I encouraged the patient to switch to unprocessed or minimally processed  complex starch and increased protein intake (animal or plant source), fruits, and vegetables.   - Patient is advised to stick to a routine mealtimes to eat 3 meals a day and avoid unnecessary snacks (to snack only to correct hypoglycemia).  - I have approached her with the following individualized plan to manage her diabetes and patient agrees:   - She is advised to continue her Tresiba  20 units SQ nightly.  She can continue her Mounjaro  15 mg SQ weekly.  I did restart her Metformin  500 mg ER daily- she had diarrhea on this previously, but it may help level out the constipation from the Mounjaro  and help glucose more.     -she is encouraged to continue monitoring glucose 4 times daily (using her CGM), before meals and before bed, and to call the clinic if she has readings less than 70 or above 200 for 3 tests in a row.  - she is warned not to take insulin  without proper monitoring per orders. - Adjustment parameters are given to her for hypo and hyperglycemia in writing.  -She has been on several other medications for diabetes and did not tolerate them including: Invokana (yeast infections), Afrezza (chronic cough), Victoza (ineffective).  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  I do  not feel this is impacting her diabetes from the available chart notes.  - Specific targets for  A1c; LDL, HDL, and Triglycerides were discussed with the patient.  2) Blood Pressure /Hypertension:  her blood pressure is controlled to target.   she is advised to continue her current medications including Benicar  40 mg p.o. daily with breakfast and Lasix 20 mg po daily.  3) Lipids/Hyperlipidemia:    Review of her recent lipid panel from 12/04/23 showed controlled LDL at 21 and elevated triglycerides of 264 (worsening slightly) .  she is advised to continue Nexlizet 180-10 mg mg daily at bedtime and Crestor 20 mg po daily, and Vascepa 2 grams twice  daily.  Side effects and precautions discussed with her.  4)  Weight/Diet:  her Body mass index is 39.54 kg/m.  -  clearly complicating her diabetes care.   she is a candidate for weight loss. I discussed with her the fact that loss of 5 - 10% of her  current body weight will have the most impact on her diabetes management.  Exercise, and detailed carbohydrates information provided  -  detailed on discharge instructions.  5) Chronic Care/Health Maintenance: -she is on ACEI/ARB and Statin medications and is encouraged to initiate and continue to follow up with Ophthalmology, Dentist, Podiatrist at least yearly or according to recommendations, and advised to stay away from smoking. I have recommended yearly flu vaccine and pneumonia vaccine at least every 5 years; moderate intensity exercise for up to 150 minutes weekly; and sleep for at least 7 hours a day.  6) Hypercalcemia Recent CMP shows high calcium of 10.3.  It has been mildly elevated for several years.  She does have vitamin D  deficiency, is on replacement with Ergocalciferol .  She does have history of kidney stones.  She does not take any calcium supplements, no Tums.  She is prescribed hydrochlorothiazide but only takes it as needed for fluid (she cannot remember the last time she needed it).  She never has had bone density exam.  She does have family history of thyroid  cancer (brother), no known personal or family history of parathyroid problems (although her brother had previous workup for high calcium as well).    Did the work up to rule out hyperparathyroidism, PTH was normal, PTH-rp was negative, Magnesium  and phosphorous were all normal, ruling out parathyroid problem.  Likely related to excess calcium in diet.  - she is advised to maintain close follow up with Shona Norleen PEDLAR, MD for primary care needs, as well as her other providers for optimal and coordinated care.     I spent  46  minutes in the care of the patient today including  review of labs from CMP, Lipids, Thyroid  Function, Hematology (current and previous including abstractions from other facilities); face-to-face time discussing  her blood glucose readings/logs, discussing hypoglycemia and hyperglycemia episodes and symptoms, medications doses, her options of short and long term treatment based on the latest standards of care / guidelines;  discussion about incorporating lifestyle medicine;  and documenting the encounter. Risk reduction counseling performed per USPSTF guidelines to reduce obesity and cardiovascular risk factors.     Please refer to Patient Instructions for Blood Glucose Monitoring and Insulin /Medications Dosing Guide  in media tab for additional information. Please  also refer to  Patient Self Inventory in the Media  tab for reviewed elements of pertinent patient history.  Arland Ada participated in the discussions, expressed understanding, and voiced agreement with the above plans.  All  questions were answered to her satisfaction. she is encouraged to contact clinic should she have any questions or concerns prior to her return visit.     Follow up plan: - Return in about 4 months (around 05/25/2024) for Diabetes F/U with A1c in office, No previsit labs, Bring meter and logs.   Benton Rio, [facility] [address] [place], [place] [address] Phone: [phone] Fax: [phone]  01/23/2024, 9:43 AM

## 2024-01-26 NOTE — Progress Notes (Unsigned)
   SUBJECTIVE: Discussed the use of AI scribe software for clinical note transcription with the patient, who gave verbal consent to proceed.  Chief Complaint: Obesity  Interim History: ***  Loretta Luna is here to discuss her progress with her obesity treatment plan. She is on the {HWW Weight Loss Plan:210964005} and states she {CHL AMB IS/IS NOT:210130109} following her eating plan approximately *** % of the time. She states she {CHL AMB IS/IS NOT:210130109} exercising *** minutes *** times per week.   OBJECTIVE: Visit Diagnoses: Problem List Items Addressed This Visit     Type 2 diabetes mellitus with obesity ([name]) - Primary   Hyperlipidemia associated with type 2 diabetes mellitus ([name])   Hypertension associated with diabetes ([name])   Vitamin D  deficiency   Obesity ([name])- Starting BMI 45.06/DATE 07/12/22   Chronic idiopathic constipation    No data recorded No data recorded No data recorded No data recorded   ASSESSMENT AND PLAN:  Diet: Loretta Luna {CHL AMB IS/IS NOT:210130109} currently in the action stage of change. As such, her goal is to {HWW Weight Loss Efforts:210964006}. She {HAS HAS WNU:81165} agreed to {HWW Weight Loss Plan:210964005}.  Exercise: Loretta Luna has been instructed {HWW Exercise:210964007} for weight loss and overall health benefits.   Behavior Modification:  We discussed the following Behavioral Modification Strategies today: {HWW Behavior Modification:210964008}. We discussed various medication options to help Loretta Luna with her weight loss efforts and we both agreed to ***.  No follow-ups on file.Loretta Luna She was informed of the importance of frequent follow up visits to maximize her success with intensive lifestyle modifications for her multiple health conditions.  Attestation Statements:   Reviewed by clinician on day of visit: allergies, medications, problem list, medical history, surgical history, family history, social history, and previous encounter notes.   Time  spent on visit including pre-visit chart review and post-visit care and charting was *** minutes.    Loretta Yamada, PA-C

## 2024-01-27 ENCOUNTER — Ambulatory Visit (INDEPENDENT_AMBULATORY_CARE_PROVIDER_SITE_OTHER): Admitting: Physician Assistant

## 2024-01-27 ENCOUNTER — Encounter (INDEPENDENT_AMBULATORY_CARE_PROVIDER_SITE_OTHER): Payer: Self-pay | Admitting: Physician Assistant

## 2024-01-27 VITALS — BP 110/74 | HR 80 | Temp 97.9°F | Ht 63.0 in | Wt 220.0 lb

## 2024-01-27 DIAGNOSIS — Z7985 Long-term (current) use of injectable non-insulin antidiabetic drugs: Secondary | ICD-10-CM

## 2024-01-27 DIAGNOSIS — E559 Vitamin D deficiency, unspecified: Secondary | ICD-10-CM

## 2024-01-27 DIAGNOSIS — I152 Hypertension secondary to endocrine disorders: Secondary | ICD-10-CM

## 2024-01-27 DIAGNOSIS — E1169 Type 2 diabetes mellitus with other specified complication: Secondary | ICD-10-CM | POA: Diagnosis not present

## 2024-01-27 DIAGNOSIS — F439 Reaction to severe stress, unspecified: Secondary | ICD-10-CM | POA: Diagnosis not present

## 2024-01-27 DIAGNOSIS — K5904 Chronic idiopathic constipation: Secondary | ICD-10-CM

## 2024-01-27 DIAGNOSIS — Z794 Long term (current) use of insulin: Secondary | ICD-10-CM

## 2024-01-27 DIAGNOSIS — Z6839 Body mass index (BMI) 39.0-39.9, adult: Secondary | ICD-10-CM

## 2024-01-27 DIAGNOSIS — G479 Sleep disorder, unspecified: Secondary | ICD-10-CM | POA: Diagnosis not present

## 2024-01-27 DIAGNOSIS — E669 Obesity, unspecified: Secondary | ICD-10-CM

## 2024-01-27 DIAGNOSIS — Z7984 Long term (current) use of oral hypoglycemic drugs: Secondary | ICD-10-CM

## 2024-02-25 ENCOUNTER — Ambulatory Visit (INDEPENDENT_AMBULATORY_CARE_PROVIDER_SITE_OTHER): Admitting: Physician Assistant

## 2024-03-16 NOTE — Progress Notes (Signed)
 SUBJECTIVE: Discussed the use of AI scribe software for clinical note transcription with the patient, who gave verbal consent to proceed.  Chief Complaint: Obesity  Interim History: She has maintained her weight since her last visit.  Down 34 lbs TBW loss of 13.4%  Loretta Luna is here to discuss her progress with her obesity treatment plan. She is on the Category 3 Plan and states she is following her eating plan approximately 25 % of the time. She states she is not exercising  due to right knee pain.  Loretta Luna is a 53 year old female with obesity, type 2 diabetes, and hypertension who presents for follow-up of her obesity treatment plan.  She has maintained her weight with a slight increase in muscle mass by one pound and a decrease in adipose mass by 1.2 pounds. Her overall weight loss is 34 pounds, and her adipose percentage is 45%. She is on a category three plan but follows it only about 25% of the time. She has not been able to exercise due to knee pain.  She experiences right knee pain that began approximately two weeks ago, characterized by swelling, warmth, and pain severe enough to require holding onto objects for support. Taking her diuretic improved the swelling. The knee pain has impacted her ability to stand and walk, and she has considered using a cane. She has not seen an orthopedic doctor for this issue but has a follow-up with her primary care physician soon. She has been taking Advil for knee pain and recently took Tylenol due to a lack of Advil.  She mentions a possible infection in her umbilicus, noting a foul smell and tenderness. She has a history of cholecystectomy in the early 1990s. No fever or chills, but she reports occasional abdominal pain, which she attributes to constipation. She has been cleaning the area daily.  Her type 2 diabetes management includes Mounjaro  15 mg once a week, metformin  500 mg once daily, and Tresiba  insulin  20 units nightly. Her blood sugars  are 'always off,' with an average of 160 over the past 30 days. She notes that her blood sugar spikes at times and feels that Tresiba  is not effective, as it causes her blood sugar to rise before it decreases.  She experiences increased stress and disordered sleep, which may be contributing to her current health issues. She works long hours and recently received a pay increase, which has helped alleviate some financial stress. No fever, chills, or significant changes in bowel habits. Reports occasional abdominal pain and constipation. No recent changes in energy levels, but notes fatigue related to blood sugar levels.  OBJECTIVE: Visit Diagnoses: Problem List Items Addressed This Visit     Type 2 diabetes mellitus with obesity ([name]) - Primary   Relevant Medications   metFORMIN  (GLUCOPHAGE -XR) 500 MG 24 hr tablet   Other Relevant Orders   CMP14+EGFR   Hemoglobin A1c   Amylase   Lipase   Hyperlipidemia associated with type 2 diabetes mellitus ([name])   Relevant Medications   metFORMIN  (GLUCOPHAGE -XR) 500 MG 24 hr tablet   Other Relevant Orders   Lipid Panel With LDL/HDL Ratio   Hypertension associated with diabetes ([name])   Relevant Medications   metFORMIN  (GLUCOPHAGE -XR) 500 MG 24 hr tablet   Vitamin D  deficiency   Relevant Orders   VITAMIN D  25 Hydroxy (Vit-D Deficiency, Fractures)   B12 deficiency   Relevant Orders   Vitamin B12   Folate   Obesity ([name])- Starting BMI 45.06/DATE 07/12/22  Relevant Medications   metFORMIN  (GLUCOPHAGE -XR) 500 MG 24 hr tablet   BMI 39.0-39.9,adult Current BMI 39.6   Disordered sleep   Other Visit Diagnoses       Panniculitis, unspecified       Relevant Orders   CBC with Differential/Platelet     Stress       Relevant Orders   TSH     Type 2 diabetes mellitus Type 2 diabetes with suboptimal glycemic control. Current medications include Mounjaro  15 mg weekly, metformin  500 mg daily, and Tresiba  20 units nightly. Blood sugar levels are  averaging 160-178 mg/dL over the past 30 days. Concerns about Tresiba 's effectiveness and potential infection affecting blood sugar levels. - Increase metformin  to 500 mg twice daily - Discussed potential side effects of increased metformin , including diarrhea - Order A1c, CBC, thyroid  function tests, lipase, and amylase to rule out pancreatitis - Discussed potential infection impact on blood sugar levels  Obesity Obesity management with a focus on body composition changes. Despite knee pain limiting exercise, there is an increase in muscle mass by one pound and a decrease in adipose mass by 1.2 pounds. Overall weight loss of 34 pounds with an adipose percentage of 45%, goal of < 35%. - Discussed the importance of exercise once knee pain is managed  Right knee pain and swelling Intermittent right knee pain and swelling with episodes of warmth and clicking. Symptoms improved with diuretic use, suggesting fluid involvement. No current redness or swelling observed. - Recommend discussing knee symptoms with primary care provider - Consider x-rays or orthopedic referral if symptoms persist  Possible umbilical infection Possible umbilical infection with foul odor and tenderness. No external redness or fever. Gallbladder surgery through umbilicus in the early 90s. - Advise cleaning with half-strength hydrogen peroxide - Monitor for increased redness, pain, or systemic symptoms - Order CBC to check for infection - Advise ER visit if symptoms worsen  Constipation Chronic constipation with occasional hard stools. Previously experienced diarrhea with metformin  use. - Monitor bowel movements - Adjust metformin  dose if diarrhea occurs  Hyperlipidemia LDL is at goal. Medication(s): Nexlizet 180-10 mg /Crestor 20 mg daily/Vascepa 2 g BID. Reports no SE.+ Mounjaro  15 mg weekly Cardiovascular risk factors: diabetes mellitus, dyslipidemia, hypertension, microalbuminuria, obesity (BMI >= 30 kg/m2), and  sedentary lifestyle  Lab Results  Component Value Date   CHOL 142 03/18/2024   HDL 48 03/18/2024   LDLCALC 55 03/18/2024   TRIG 245 (H) 03/18/2024   CHOLHDL 4.7 Ratio 03/28/2007   CHOLHDL 4.1 07/01/2006   Lab Results  Component Value Date   ALT 25 03/18/2024   AST 20 03/18/2024   ALKPHOS 105 03/18/2024   BILITOT 0.3 03/18/2024   The 10-year ASCVD risk score (Arnett DK, et al., 2019) is: 2.7%   Values used to calculate the score:     Age: 49 years     Clincally relevant sex: Female     Is Non-Hispanic African American: No     Diabetic: Yes     Tobacco smoker: No     Systolic Blood Pressure: 109 mmHg     Is BP treated: Yes     HDL Cholesterol: 48 mg/dL     Total Cholesterol: 142 mg/dL  Plan: Continue all medications list above.  Continue to work on nutrition plan -decreasing simple carbohydrates, increasing lean proteins, decreasing saturated fats and cholesterol , avoiding trans fats and exercise as able to promote weight loss, improve lipids and decrease cardiovascular risks.  Fatigue On B 12  1000 mcg daily and vitamin D  supplementation Recheck labs and adjust supplementation accordingly.   Hypertension Hypertension well controlled, stable, asymptomatic, and no significant medication side effects noted.  Medication(s): olmesartan  20 mg daily   Hydrochlorothiazide PRN edema/Lasix prn edema  BP Readings from Last 3 Encounters:  03/18/24 109/71  01/27/24 110/74  01/23/24 114/70   Lab Results  Component Value Date   CREATININE 0.73 03/18/2024   CREATININE 0.83 12/04/2023   CREATININE 0.68 10/09/2023   No results found for: GFR  Plan: Continue all antihypertensives at current dosages. Continue to work on nutrition plan to promote weight loss and improve BP control.   Vitamin D  Deficiency Vitamin D  is not at goal of 50.  Most recent vitamin D  level was 40. She is on  prescription ergocalciferol  50,000 IU weekly. No N/V or muscle weakness noted  with  Ergocalciferol .  Lab Results  Component Value Date   VD25OH 39.9 03/18/2024   VD25OH 40.0 12/04/2023   VD25OH 37.3 10/09/2023    Plan: Continue  prescription ergocalciferol  50,000 IU weekly Low vitamin D  levels can be associated with adiposity and may result in leptin resistance and weight gain. Also associated with fatigue.  Currently on vitamin D  supplementation without any adverse effects such as nausea, vomiting or muscle weakness.  Recheck vitamin D  level today and adjust supplementation as indicated.     Vitals Temp: 97.9 F (36.6 C) BP: 109/71 Pulse Rate: 83 SpO2: 97 %   Anthropometric Measurements Height: 5' 3 (1.6 m) Weight: 220 lb (99.8 kg) BMI (Calculated): 38.98 Weight at Last Visit: 220 lb Weight Lost Since Last Visit: 0 Weight Gained Since Last Visit: 0 Starting Weight: 254 lb Total Weight Loss (lbs): 34 lb (15.4 kg) Peak Weight: 289 lb   Body Composition  Body Fat %: 45 % Fat Mass (lbs): 99.4 lbs Muscle Mass (lbs): 115.2 lbs Total Body Water (lbs): 78.6 lbs Visceral Fat Rating : 13   Other Clinical Data Fasting: No Labs: No Today's Visit #: 22 Starting Date: 07/12/22     ASSESSMENT AND PLAN:  Diet: Bonniejean is currently in the action stage of change. As such, her goal is to continue with weight loss efforts. She has agreed to Category 3 Plan.  Exercise: Rebie has been instructed exercise as able with knee pain for weight loss and overall health benefits.   Behavior Modification:  We discussed the following Behavioral Modification Strategies today: increasing lean protein intake, decreasing simple carbohydrates, increasing vegetables, increase H2O intake, increase high fiber foods, no skipping meals, meal planning and cooking strategies, emotional eating strategies , avoiding temptations, and planning for success. We discussed various medication options to help Aury with her weight loss efforts and we both agreed to continue current  treatment plan and increase metformin  to 500 mg twice daily.  Return in about 4 weeks (around 04/15/2024).SABRA She was informed of the importance of frequent follow up visits to maximize her success with intensive lifestyle modifications for her multiple health conditions.  Attestation Statements:   Reviewed by clinician on day of visit: allergies, medications, problem list, medical history, surgical history, family history, social history, and previous encounter notes.   Time spent on visit including pre-visit chart review and post-visit care and charting was 32 minutes.    Taven Strite, PA-C

## 2024-03-18 ENCOUNTER — Encounter (INDEPENDENT_AMBULATORY_CARE_PROVIDER_SITE_OTHER): Payer: Self-pay | Admitting: Physician Assistant

## 2024-03-18 ENCOUNTER — Ambulatory Visit (INDEPENDENT_AMBULATORY_CARE_PROVIDER_SITE_OTHER): Admitting: Physician Assistant

## 2024-03-18 VITALS — BP 109/71 | HR 83 | Temp 97.9°F | Ht 63.0 in | Wt 220.0 lb

## 2024-03-18 DIAGNOSIS — E669 Obesity, unspecified: Secondary | ICD-10-CM | POA: Diagnosis not present

## 2024-03-18 DIAGNOSIS — I152 Hypertension secondary to endocrine disorders: Secondary | ICD-10-CM

## 2024-03-18 DIAGNOSIS — F439 Reaction to severe stress, unspecified: Secondary | ICD-10-CM

## 2024-03-18 DIAGNOSIS — Z7984 Long term (current) use of oral hypoglycemic drugs: Secondary | ICD-10-CM

## 2024-03-18 DIAGNOSIS — E785 Hyperlipidemia, unspecified: Secondary | ICD-10-CM

## 2024-03-18 DIAGNOSIS — E1159 Type 2 diabetes mellitus with other circulatory complications: Secondary | ICD-10-CM | POA: Diagnosis not present

## 2024-03-18 DIAGNOSIS — M793 Panniculitis, unspecified: Secondary | ICD-10-CM

## 2024-03-18 DIAGNOSIS — E538 Deficiency of other specified B group vitamins: Secondary | ICD-10-CM | POA: Diagnosis not present

## 2024-03-18 DIAGNOSIS — E559 Vitamin D deficiency, unspecified: Secondary | ICD-10-CM

## 2024-03-18 DIAGNOSIS — Z6839 Body mass index (BMI) 39.0-39.9, adult: Secondary | ICD-10-CM

## 2024-03-18 DIAGNOSIS — G479 Sleep disorder, unspecified: Secondary | ICD-10-CM

## 2024-03-18 DIAGNOSIS — E1169 Type 2 diabetes mellitus with other specified complication: Secondary | ICD-10-CM

## 2024-03-18 MED ORDER — METFORMIN HCL ER 500 MG PO TB24: 500.0000 mg | ORAL_TABLET | Freq: Two times a day (BID) | ORAL | 1 refills | Status: DC

## 2024-03-19 LAB — LIPID PANEL WITH LDL/HDL RATIO
Cholesterol, Total: 142 mg/dL (ref 100–199)
HDL: 48 mg/dL (ref >39)
LDL Chol Calc (NIH): 55 mg/dL (ref 0–99)
LDL/HDL Ratio: 1.1 ratio (ref 0.0–3.2)
Triglycerides: 245 mg/dL — ABNORMAL HIGH (ref 0–149)
VLDL Cholesterol Cal: 39 mg/dL (ref 5–40)

## 2024-03-19 LAB — CBC WITH DIFFERENTIAL/PLATELET
Basophils Absolute: 0.1 x10E3/uL (ref 0.0–0.2)
Basos: 1 %
EOS (ABSOLUTE): 0.4 x10E3/uL (ref 0.0–0.4)
Eos: 5 %
Hematocrit: 49.4 % — ABNORMAL HIGH (ref 34.0–46.6)
Hemoglobin: 15.8 g/dL (ref 11.1–15.9)
Immature Grans (Abs): 0 x10E3/uL (ref 0.0–0.1)
Immature Granulocytes: 0 %
Lymphocytes Absolute: 1.6 x10E3/uL (ref 0.7–3.1)
Lymphs: 23 %
MCH: 28.3 pg (ref 26.6–33.0)
MCHC: 32 g/dL (ref 31.5–35.7)
MCV: 89 fL (ref 79–97)
Monocytes Absolute: 0.5 x10E3/uL (ref 0.1–0.9)
Monocytes: 7 %
Neutrophils Absolute: 4.4 x10E3/uL (ref 1.4–7.0)
Neutrophils: 64 %
Platelets: 303 x10E3/uL (ref 150–450)
RBC: 5.58 x10E6/uL — ABNORMAL HIGH (ref 3.77–5.28)
RDW: 12.6 % (ref 11.7–15.4)
WBC: 7 x10E3/uL (ref 3.4–10.8)

## 2024-03-19 LAB — CMP14+EGFR
ALT: 25 IU/L (ref 0–32)
AST: 20 IU/L (ref 0–40)
Albumin: 5 g/dL — ABNORMAL HIGH (ref 3.8–4.9)
Alkaline Phosphatase: 105 IU/L (ref 49–135)
BUN/Creatinine Ratio: 23 (ref 9–23)
BUN: 17 mg/dL (ref 6–24)
Bilirubin Total: 0.3 mg/dL (ref 0.0–1.2)
CO2: 23 mmol/L (ref 20–29)
Calcium: 10.6 mg/dL — ABNORMAL HIGH (ref 8.7–10.2)
Chloride: 101 mmol/L (ref 96–106)
Creatinine, Ser: 0.73 mg/dL (ref 0.57–1.00)
Globulin, Total: 2.4 g/dL (ref 1.5–4.5)
Glucose: 126 mg/dL — ABNORMAL HIGH (ref 70–99)
Potassium: 4.3 mmol/L (ref 3.5–5.2)
Sodium: 142 mmol/L (ref 134–144)
Total Protein: 7.4 g/dL (ref 6.0–8.5)
eGFR: 98 mL/min/1.73 (ref >59)

## 2024-03-19 LAB — VITAMIN D 25 HYDROXY (VIT D DEFICIENCY, FRACTURES): Vit D, 25-Hydroxy: 39.9 ng/mL (ref 30.0–100.0)

## 2024-03-19 LAB — LIPASE: Lipase: 132 U/L — ABNORMAL HIGH (ref 14–72)

## 2024-03-19 LAB — TSH: TSH: 3.93 u[IU]/mL (ref 0.450–4.500)

## 2024-03-19 LAB — VITAMIN B12: Vitamin B-12: 833 pg/mL (ref 232–1245)

## 2024-03-19 LAB — HEMOGLOBIN A1C
Est. average glucose Bld gHb Est-mCnc: 171 mg/dL
Hgb A1c MFr Bld: 7.6 % — ABNORMAL HIGH (ref 4.8–5.6)

## 2024-03-19 LAB — AMYLASE: Amylase: 101 U/L (ref 31–110)

## 2024-03-19 LAB — FOLATE: Folate: 7.4 ng/mL (ref >3.0)

## 2024-03-20 DIAGNOSIS — E559 Vitamin D deficiency, unspecified: Secondary | ICD-10-CM | POA: Diagnosis not present

## 2024-03-20 DIAGNOSIS — E876 Hypokalemia: Secondary | ICD-10-CM | POA: Diagnosis not present

## 2024-03-20 DIAGNOSIS — E7849 Other hyperlipidemia: Secondary | ICD-10-CM | POA: Diagnosis not present

## 2024-03-20 DIAGNOSIS — E1169 Type 2 diabetes mellitus with other specified complication: Secondary | ICD-10-CM | POA: Diagnosis not present

## 2024-03-20 DIAGNOSIS — I10 Essential (primary) hypertension: Secondary | ICD-10-CM | POA: Diagnosis not present

## 2024-03-30 DIAGNOSIS — N951 Menopausal and female climacteric states: Secondary | ICD-10-CM | POA: Diagnosis not present

## 2024-03-30 DIAGNOSIS — R319 Hematuria, unspecified: Secondary | ICD-10-CM | POA: Diagnosis not present

## 2024-03-30 DIAGNOSIS — Z6838 Body mass index (BMI) 38.0-38.9, adult: Secondary | ICD-10-CM | POA: Diagnosis not present

## 2024-03-30 DIAGNOSIS — Z01419 Encounter for gynecological examination (general) (routine) without abnormal findings: Secondary | ICD-10-CM | POA: Diagnosis not present

## 2024-04-03 ENCOUNTER — Encounter: Payer: Self-pay | Admitting: Nurse Practitioner

## 2024-04-15 ENCOUNTER — Encounter (INDEPENDENT_AMBULATORY_CARE_PROVIDER_SITE_OTHER): Payer: Self-pay | Admitting: Physician Assistant

## 2024-04-15 ENCOUNTER — Ambulatory Visit (INDEPENDENT_AMBULATORY_CARE_PROVIDER_SITE_OTHER): Admitting: Physician Assistant

## 2024-04-15 DIAGNOSIS — E1169 Type 2 diabetes mellitus with other specified complication: Secondary | ICD-10-CM

## 2024-04-15 DIAGNOSIS — E785 Hyperlipidemia, unspecified: Secondary | ICD-10-CM | POA: Diagnosis not present

## 2024-04-15 DIAGNOSIS — E1159 Type 2 diabetes mellitus with other circulatory complications: Secondary | ICD-10-CM | POA: Diagnosis not present

## 2024-04-15 DIAGNOSIS — Z7985 Long-term (current) use of injectable non-insulin antidiabetic drugs: Secondary | ICD-10-CM

## 2024-04-15 DIAGNOSIS — E669 Obesity, unspecified: Secondary | ICD-10-CM

## 2024-04-15 DIAGNOSIS — I152 Hypertension secondary to endocrine disorders: Secondary | ICD-10-CM

## 2024-04-15 DIAGNOSIS — Z794 Long term (current) use of insulin: Secondary | ICD-10-CM

## 2024-04-15 DIAGNOSIS — F439 Reaction to severe stress, unspecified: Secondary | ICD-10-CM

## 2024-04-15 DIAGNOSIS — E559 Vitamin D deficiency, unspecified: Secondary | ICD-10-CM

## 2024-04-15 DIAGNOSIS — Z7984 Long term (current) use of oral hypoglycemic drugs: Secondary | ICD-10-CM

## 2024-04-15 DIAGNOSIS — Z6839 Body mass index (BMI) 39.0-39.9, adult: Secondary | ICD-10-CM

## 2024-04-15 NOTE — Progress Notes (Unsigned)
 SUBJECTIVE: Discussed the use of AI scribe software for clinical note transcription with the patient, who gave verbal consent to proceed.  Chief Complaint: Obesity  Interim History: She is up 3 lbs since her last visit.  Down 31 lbs overall.  Loretta Luna is here to discuss her progress with her obesity treatment plan. She is on the Category 3 Plan and states she is following her eating plan approximately 50 % of the time. She states she is exercising walking and sit ups 20+ minutes several times per week.  Loretta Luna is a 53 year old female with obesity, type 2 diabetes, hypertension, and hyperlipidemia who presents for follow-up of her obesity treatment plan.  She is working on resuming her exercise and dietary regimen, having started walking again last week. She is attempting to adhere to her dietary plan, exploring alternative breakfast options such as egg cups with vegetables and cottage cheese for added protein, as she dislikes eggs. She has stopped consuming  protein shakes and is focusing on meeting her protein needs through her diet, as she consumes meat. She is also working on reducing her intake of diet drinks, particularly diet [facility], and has made progress in this area.  She recently visited her primary care physician and was found to have significant blood and protein in her urine. A subsequent visit to a gynecologist confirmed a urinary tract infection (UTI), for which she completed a course of antibiotics despite experiencing adverse effects. This is her third UTI this year. She has a history of kidney stones, with a current stone located at the top of her kidney, and has been experiencing repeated infections.  She reported that her gynecologist told her she had a cyst at the top of her kidney. She has had elevated calcium levels. She is unsure if her parathyroid levels have been checked before. Her diabetes management is affected by her UTIs, as her blood sugar levels normalize  when on antibiotics but rise again after completing the course.    OBJECTIVE: Visit Diagnoses: Problem List Items Addressed This Visit     Hyperlipidemia associated with type 2 diabetes mellitus ([name])   Hypertension associated with diabetes ([name])   Vitamin D  deficiency   Obesity ([name])- Starting BMI 45.06/DATE 07/12/22   Diabetes mellitus ([name])   BMI 39.0-39.9,adult Current BMI 39.6   Other Visit Diagnoses       Serum calcium elevated    -  Primary   Relevant Orders   Parathyroid hormone, intact (no Ca)   CMP14+EGFR     Stress         Recurrent urinary tract infections with nephrolithiasis, left kidney Recurrent UTIs-?related to a ? Residual kidney stone  Previous CT showed a 2mm calculus in the upper left kidney. Seen by urology.  Persistent /recurrent infections may require intervention.e. - Consider re-evaluation by urology if recurrent infections persist. 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 Symptoms include recurrent kidney stones and fatigue. Vitamin D  levels are corrected, but parathyroid hormone levels need to be checked. Hyperparathyroidism can lead to increased calcium in the bloodstream, potentially requiring medication or surgical intervention. - Order PTH and repeat calcium levels/CMET to assess for hyperparathyroidism. - Consider referral to endocrinology for further evaluation and management if PTH is elevated.  Type 2 diabetes mellitus Diabetes management is complicated by recurrent UTIs and  potential hyperparathyroidism. Antibiotics have temporarily improved blood sugar control, indicating possible infection-related fluctuations. Lab Results  Component Value Date   HGBA1C 7.6 (H) 03/18/2024   HGBA1C 6.7 (H) 12/04/2023   HGBA1C 7.0 (H)  06/12/2023   Lab Results  Component Value Date   LDLCALC 55 03/18/2024   CREATININE 0.73 03/18/2024    - Monitor blood sugar levels closely, especially during and after antibiotic treatment for UTIs. - Discuss diabetes management with endocrinologist at next appointment on November 25.  Obesity She is working on getting back on track with her weight management plan. She has started walking again and is focusing on dietary changes, including preparing egg cups for breakfast to increase protein intake. She is aware of the need to manage her diet and is using My[employer]itnessPal for tracking. - Encourage continuation of walking and dietary changes. - Provide egg cup recipe to increase breakfast protein intake. - Recommend using MyFitnessPal or L[employer] It for dietary tracking. - Advise aiming for 1500-1600 calories per day with 100+ grams of protein and 25+ grams of fiber.  Hypertension Hypertension is being managed by cardiology.  Hyperlipidemia Hyperlipidemia is being managed by cardiology.  General Health Maintenance She is due for a bone density scan in January, which is important given the suspicion of hyperparathyroidism and potential impact on bone health. - Ensure bone density scan is completed in January.  Vitals Temp: 98.2 F (36.8 C) BP: 115/60 Pulse Rate: 84 SpO2: 96 %   Anthropometric Measurements Height: 5' 3 (1.6 m) Weight: 223 lb (101.2 kg) BMI (Calculated): 39.51 Weight at Last Visit: 220 lb Weight Lost Since Last Visit: 0 Weight Gained Since Last Visit: 3 lb Starting Weight: 254 lb Total Weight Loss (lbs): 31 lb (14.1 kg) Peak Weight: 289 lb   Body Composition  Body Fat %: 46.7 % Fat Mass (lbs): 104.6 lbs Muscle Mass (lbs): 113.4 lbs Total Body Water (lbs): 81.2 lbs Visceral Fat Rating : 14   Other Clinical Data Fasting: Yes Labs: No Today's Visit #: 23 Starting Date: 07/12/22     ASSESSMENT AND PLAN:  Diet: Glorene is currently in the action  stage of change. As such, her goal is to get back to weight loss efforts. She has agreed to Category 3 Plan and keeping a food journal and adhering to recommended goals of 1500-1600 calories and 100+ grams of protein. And work on journaling at least 3-4 days per week.   Exercise: Maicey has been instructed to work up to a goal of 150 minutes of combined cardio and strengthening exercise per week and to continue exercising as is for weight loss and overall health benefits.   Behavior Modification:  We discussed the following Behavioral Modification Strategies today: increasing lean protein intake, decreasing simple carbohydrates, increasing vegetables, increase H2O intake, increase high fiber foods, meal planning and cooking strategies, better snacking choices, avoiding temptations, planning for success, and keep a strict food journal. We discussed various medication options to help Anquanette with her weight loss efforts and we both agreed to continue current treatment plan.  Return in about 4 weeks (around 05/13/2024).SABRA She was informed of the importance of frequent follow up visits to maximize her success with intensive lifestyle modifications for her multiple health conditions.  Attestation Statements:   Reviewed by clinician on day of visit: allergies, medications, problem list, medical history, surgical history, family history, social history, and previous encounter notes.   Time spent on visit including pre-visit chart review and post-visit care and charting was 34  minutes.    Shanan Fitzpatrick, PA-C

## 2024-04-17 LAB — CMP14+EGFR
ALT: 35 IU/L — ABNORMAL HIGH (ref 0–32)
AST: 35 IU/L (ref 0–40)
Albumin: 4.7 g/dL (ref 3.8–4.9)
Alkaline Phosphatase: 86 IU/L (ref 49–135)
BUN/Creatinine Ratio: 18 (ref 9–23)
BUN: 13 mg/dL (ref 6–24)
Bilirubin Total: 0.3 mg/dL (ref 0.0–1.2)
CO2: 24 mmol/L (ref 20–29)
Calcium: 10.2 mg/dL (ref 8.7–10.2)
Chloride: 103 mmol/L (ref 96–106)
Creatinine, Ser: 0.74 mg/dL (ref 0.57–1.00)
Globulin, Total: 2.2 g/dL (ref 1.5–4.5)
Glucose: 128 mg/dL — ABNORMAL HIGH (ref 70–99)
Potassium: 4.5 mmol/L (ref 3.5–5.2)
Sodium: 142 mmol/L (ref 134–144)
Total Protein: 6.9 g/dL (ref 6.0–8.5)
eGFR: 96 mL/min/1.73 (ref >59)

## 2024-04-17 LAB — PARATHYROID HORMONE, INTACT (NO CA): PTH: 39 pg/mL (ref 15–65)

## 2024-04-19 ENCOUNTER — Ambulatory Visit (INDEPENDENT_AMBULATORY_CARE_PROVIDER_SITE_OTHER): Payer: Self-pay | Admitting: Physician Assistant

## 2024-04-28 DIAGNOSIS — Z79899 Other long term (current) drug therapy: Secondary | ICD-10-CM | POA: Diagnosis not present

## 2024-04-28 DIAGNOSIS — E7800 Pure hypercholesterolemia, unspecified: Secondary | ICD-10-CM | POA: Diagnosis not present

## 2024-04-28 DIAGNOSIS — E119 Type 2 diabetes mellitus without complications: Secondary | ICD-10-CM | POA: Diagnosis not present

## 2024-04-28 DIAGNOSIS — D1771 Benign lipomatous neoplasm of kidney: Secondary | ICD-10-CM | POA: Diagnosis not present

## 2024-04-28 DIAGNOSIS — Z794 Long term (current) use of insulin: Secondary | ICD-10-CM | POA: Diagnosis not present

## 2024-04-28 DIAGNOSIS — I10 Essential (primary) hypertension: Secondary | ICD-10-CM | POA: Diagnosis not present

## 2024-04-28 DIAGNOSIS — Z87442 Personal history of urinary calculi: Secondary | ICD-10-CM | POA: Diagnosis not present

## 2024-04-28 DIAGNOSIS — K760 Fatty (change of) liver, not elsewhere classified: Secondary | ICD-10-CM | POA: Diagnosis not present

## 2024-04-28 DIAGNOSIS — K862 Cyst of pancreas: Secondary | ICD-10-CM | POA: Diagnosis not present

## 2024-04-28 DIAGNOSIS — Z7984 Long term (current) use of oral hypoglycemic drugs: Secondary | ICD-10-CM | POA: Diagnosis not present

## 2024-04-28 DIAGNOSIS — Z7985 Long-term (current) use of injectable non-insulin antidiabetic drugs: Secondary | ICD-10-CM | POA: Diagnosis not present

## 2024-04-28 DIAGNOSIS — Z87440 Personal history of urinary (tract) infections: Secondary | ICD-10-CM | POA: Diagnosis not present

## 2024-05-03 ENCOUNTER — Other Ambulatory Visit (HOSPITAL_BASED_OUTPATIENT_CLINIC_OR_DEPARTMENT_OTHER): Payer: Self-pay | Admitting: Internal Medicine

## 2024-05-06 ENCOUNTER — Other Ambulatory Visit: Payer: Self-pay | Admitting: *Deleted

## 2024-05-06 DIAGNOSIS — E1169 Type 2 diabetes mellitus with other specified complication: Secondary | ICD-10-CM

## 2024-05-06 DIAGNOSIS — Z794 Long term (current) use of insulin: Secondary | ICD-10-CM

## 2024-05-06 MED ORDER — DEXCOM G7 SENSOR MISC: 2 refills | Status: DC

## 2024-05-13 ENCOUNTER — Ambulatory Visit (INDEPENDENT_AMBULATORY_CARE_PROVIDER_SITE_OTHER): Payer: Self-pay | Admitting: Physician Assistant

## 2024-05-20 ENCOUNTER — Encounter: Payer: Self-pay | Admitting: Nurse Practitioner

## 2024-05-26 ENCOUNTER — Ambulatory Visit: Admitting: Nurse Practitioner

## 2024-05-26 ENCOUNTER — Encounter: Payer: Self-pay | Admitting: Nurse Practitioner

## 2024-05-26 VITALS — BP 114/76 | HR 77 | Ht 63.0 in | Wt 229.0 lb

## 2024-05-26 DIAGNOSIS — E119 Type 2 diabetes mellitus without complications: Secondary | ICD-10-CM | POA: Diagnosis not present

## 2024-05-26 DIAGNOSIS — Z7985 Long-term (current) use of injectable non-insulin antidiabetic drugs: Secondary | ICD-10-CM

## 2024-05-26 DIAGNOSIS — E1169 Type 2 diabetes mellitus with other specified complication: Secondary | ICD-10-CM

## 2024-05-26 DIAGNOSIS — Z794 Long term (current) use of insulin: Secondary | ICD-10-CM

## 2024-05-26 MED ORDER — METFORMIN HCL ER 500 MG PO TB24: 500.0000 mg | ORAL_TABLET | Freq: Two times a day (BID) | ORAL | 3 refills | Status: AC

## 2024-05-26 MED ORDER — DEXCOM G7 SENSOR MISC: 3 refills | Status: AC

## 2024-05-26 MED ORDER — TRESIBA FLEXTOUCH 200 UNIT/ML ~~LOC~~ SOPN: 60.0000 [IU] | PEN_INJECTOR | Freq: Every evening | SUBCUTANEOUS | 3 refills | Status: AC

## 2024-05-26 MED ORDER — MOUNJARO 15 MG/0.5ML ~~LOC~~ SOAJ: 15.0000 mg | SUBCUTANEOUS | 3 refills | Status: AC

## 2024-05-26 NOTE — Progress Notes (Signed)
 Endocrinology Follow Up Note       05/26/2024, 8:57 AM   Subjective:    Patient ID: Loretta Luna, female    DOB: Sep 11, 1970.  Niomie Englert is being seen in consultation for management of currently uncontrolled symptomatic diabetes requested by  Shona Norleen PEDLAR, MD.  She also sees healthy [facility] in [place].   Past Medical History:  Diagnosis Date   Anxiety    Asthma    Back pain    Chest pain    Constipation    Depression    Diabetes ([name])    Essential hypertension    Fatty liver    GERD (gastroesophageal reflux disease)    History of swelling of feet    Hypercalcemia    Hyperglycemia    Hypokalemia    IBS (irritable bowel syndrome)    Infertility, female    Insomnia    Joint pain    Mixed hyperlipidemia    Obesity    Pancreatic cyst    Polycystic ovarian syndrome    Proteinuria    Systolic murmur    Type 2 diabetes mellitus ([name])     Past Surgical History:  Procedure Laterality Date   CHOLECYSTECTOMY      Social History   Socioeconomic History   Marital status: Single    Spouse name: Not on file   Number of children: Not on file   Years of education: Not on file   Highest education level: Not on file  Occupational History   Not on file  Tobacco Use   Smoking status: Never   Smokeless tobacco: Never  Substance and Sexual Activity   Alcohol use: No    Alcohol/week: 0.0 standard drinks of alcohol   Drug use: Never   Sexual activity: Not Currently  Other Topics Concern   Not on file  Social History Narrative   Not on file   Social Drivers of Health   Financial Resource Strain: Not on file  Food Insecurity: Not on file  Transportation Needs: Not on file  Physical Activity: Insufficiently Active (05/05/2020)   [facility]   Exercise Vital Sign    On average, how many days per week do you engage in moderate to strenuous exercise (like a brisk walk)?: 3  days    On average, how many minutes do you engage in exercise at this level?: 30 min  Stress: Not on file  Social Connections: Not on file    Family History  Problem Relation Age of Onset   COPD Mother    Hyperlipidemia Mother    Hypertension Mother    Stroke Mother    Diabetes Mother    Heart disease Mother    Obesity Mother    Hyperlipidemia Father    Hypertension Father    Stroke Father    Diabetes Father    Sleep apnea Father    Obesity Father    Hypertension Sister    Asthma Brother    Hyperlipidemia Brother    Hypertension Brother    Cancer Maternal Grandmother    COPD Maternal Grandfather    Hyperlipidemia Maternal Grandfather    Cancer Paternal Grandmother  Stroke Paternal Grandfather     Outpatient Encounter Medications as of 05/26/2024  Medication Sig   Bempedoic Acid-Ezetimibe (NEXLIZET) 180-10 MG TABS Take by mouth.   furosemide (LASIX) 20 MG tablet Take 20 mg by mouth as needed.   HYDROCHLOROTHIAZIDE PO Take by mouth as needed.   icosapent Ethyl (VASCEPA) 1 g capsule Take 2 g by mouth 2 (two) times daily.   Insulin  Pen Needle (PEN NEEDLES) 31G X 8 MM MISC Use to inject insulin  once daily   loratadine (CLARITIN) 10 MG tablet Take 10 mg by mouth daily.   Magnesium  250 MG TABS May use CALM supplement- 325mg  mag 1-twice daily (Patient taking differently: May use CALM supplement- 325mg  mag 2 pills -twice daily)   olmesartan  (BENICAR ) 20 MG tablet Take 1 tablet (20 mg total) by mouth daily.   OVER THE COUNTER MEDICATION at bedtime. Cranberry Extract 36 mg - patient takes 1 by mouth at bedtime.   potassium chloride  (KLOR-CON ) 10 MEQ tablet Take 10 mEq by mouth 2 (two) times daily.   rosuvastatin (CRESTOR) 20 MG tablet Take 20 mg by mouth daily.   vitamin B-12 (CYANOCOBALAMIN ) 500 MCG tablet Take 500 mcg by mouth daily.   Vitamin D , Ergocalciferol , (DRISDOL ) 1.25 MG (50000 UNIT) CAPS capsule Take 1 capsule (50,000 Units total) by mouth every 7 (seven) days.    [DISCONTINUED] Continuous Glucose Sensor (DEXCOM G7 SENSOR) MISC Change sensor every 10 days as directed.   [DISCONTINUED] metFORMIN  (GLUCOPHAGE -XR) 500 MG 24 hr tablet Take 1 tablet (500 mg total) by mouth 2 (two) times daily with a meal.   [DISCONTINUED] MOUNJARO  15 MG/0.5ML Pen Inject 15 mg into the skin once a week.   [DISCONTINUED] TRESIBA  FLEXTOUCH 200 UNIT/ML FlexTouch Pen Inject 60 Units into the skin at bedtime. (Patient taking differently: Inject 30 Units into the skin at bedtime.)   Continuous Glucose Sensor (DEXCOM G7 SENSOR) MISC Change sensor every 10 days as directed.   metFORMIN  (GLUCOPHAGE -XR) 500 MG 24 hr tablet Take 1 tablet (500 mg total) by mouth 2 (two) times daily with a meal.   tirzepatide  (MOUNJARO ) 15 MG/0.5ML Pen Inject 15 mg into the skin once a week.   TRESIBA  FLEXTOUCH 200 UNIT/ML FlexTouch Pen Inject 60 Units into the skin at bedtime.   No facility-administered encounter medications on file as of 05/26/2024.    ALLERGIES: Allergies  Allergen Reactions   Iodinated Contrast Media Hives and Swelling    Ok with 13 hour premed   Food     shellfish   Iodine     REACTION: swelling, hives, tachypnea   Metformin  And Related Diarrhea    VACCINATION STATUS: Immunization History  Administered Date(s) Administered   Influenza Whole 04/10/2007    Diabetes She presents for her follow-up diabetic visit. She has type 2 diabetes mellitus. Onset time: diagnosed at approx age of 8. Her disease course has been improving. There are no hypoglycemic associated symptoms. There are no diabetic associated symptoms. There are no diabetic complications. Risk factors for coronary artery disease include diabetes mellitus, dyslipidemia, family history, obesity, hypertension and sedentary lifestyle. Current diabetic treatment includes insulin  injections and oral agent (monotherapy) (and Mounjaro ). She is compliant with treatment most of the time (has had trouble getting Mounjaro   intermittently due to supply issues). Her weight is fluctuating minimally. She is following a generally healthy diet. Meal planning includes ADA exchanges, avoidance of concentrated sweets, carbohydrate counting and calorie counting. She has had a previous visit with a dietitian. She rarely participates in  exercise. Her home blood glucose trend is decreasing steadily. Her overall blood glucose range is 140-180 mg/dl. (She presents today with her CGM, showing mostly at target glycemic profile.  Her most recent A1c on 10/28 was 7.3%, improving from last visit of 7.6%.  She denies any significant hypoglycemia.  Analysis of her CGM shows TIR 70%, TAR 30%, TBR 0%.  She has been having recurrent UTIs, has started daily supplement to help prevent.) An ACE inhibitor/angiotensin II receptor blocker is being taken. She sees a podiatrist.Eye exam is current.   Review of systems  Constitutional: + increasing body weight,  current Body mass index is 40.57 kg/m. , no fatigue, no subjective hyperthermia, no subjective hypothermia Eyes: no blurry vision, no xerophthalmia ENT: no sore throat, no nodules palpated in throat, no dysphagia/odynophagia, no hoarseness Cardiovascular: no chest pain, no shortness of breath, no palpitations, no leg swelling Respiratory: no cough, no shortness of breath Gastrointestinal: no nausea/vomiting/diarrhea, + constipation Musculoskeletal: no muscle/joint aches Skin: no rashes, no hyperemia Neurological: no tremors, no numbness, no tingling, no dizziness Psychiatric: no depression, no anxiety  Objective:     BP 114/76 (BP Location: Left Arm, Patient Position: Sitting)   Pulse 77   Ht 5' 3 (1.6 m)   Wt 229 lb (103.9 kg)   LMP  (LMP Unknown)   BMI 40.57 kg/m   Wt Readings from Last 3 Encounters:  05/26/24 229 lb (103.9 kg)  04/15/24 223 lb (101.2 kg)  03/18/24 220 lb (99.8 kg)     BP Readings from Last 3 Encounters:  05/26/24 114/76  04/15/24 115/60  03/18/24 109/71       Physical Exam- Limited  Constitutional:  Body mass index is 40.57 kg/m. , not in acute distress, normal state of mind Eyes:  EOMI, no exophthalmos Musculoskeletal: no gross deformities, strength intact in all four extremities, no gross restriction of joint movements Skin:  no rashes, no hyperemia Neurological: no tremor with outstretched hands   Diabetic Foot Exam - Simple   No data filed    CMP ( most recent) CMP     Component Value Date/Time   NA 142 04/15/2024 1038   K 4.5 04/15/2024 1038   CL 103 04/15/2024 1038   CO2 24 04/15/2024 1038   GLUCOSE 128 (H) 04/15/2024 1038   GLUCOSE 120 (H) 07/15/2012 1058   BUN 13 04/15/2024 1038   CREATININE 0.74 04/15/2024 1038   CREATININE 0.77 07/15/2012 1058   CALCIUM 10.2 04/15/2024 1038   PROT 6.9 04/15/2024 1038   ALBUMIN 4.7 04/15/2024 1038   AST 35 04/15/2024 1038   ALT 35 (H) 04/15/2024 1038   ALKPHOS 86 04/15/2024 1038   BILITOT 0.3 04/15/2024 1038     Diabetic Labs (most recent): Lab Results  Component Value Date   HGBA1C 7.6 (H) 03/18/2024   HGBA1C 6.7 (H) 12/04/2023   HGBA1C 7.0 (H) 06/12/2023     Lipid Panel ( most recent) Lipid Panel     Component Value Date/Time   CHOL 142 03/18/2024 1045   TRIG 245 (H) 03/18/2024 1045   HDL 48 03/18/2024 1045   CHOLHDL 4.7 Ratio 03/28/2007 2157   VLDL NOT CALC mg/dL 90/73/7991 7842   LDLCALC 55 03/18/2024 1045   LABVLDL 39 03/18/2024 1045      Lab Results  Component Value Date   TSH 3.930 03/18/2024   TSH 3.780 12/04/2023   TSH 3.110 07/12/2022   FREET4 1.09 12/04/2023   FREET4 1.10 07/12/2022  Assessment & Plan:   1) Type 2 diabetes mellitus without complication, with long-term current use of insulin   She presents today with her CGM, showing mostly at target glycemic profile.  Her most recent A1c on 10/28 was 7.3%, improving from last visit of 7.6%.  She denies any significant hypoglycemia.  Analysis of her CGM shows TIR 70%, TAR 30%,  TBR 0%.  She has been having recurrent UTIs, has started daily supplement to help prevent.  - Jaquesha Boroff has currently uncontrolled symptomatic type 2 DM since 54 years of age.   -Recent labs reviewed.  - I had a long discussion with her about the progressive nature of diabetes and the pathology behind its complications. -her diabetes is not currently complicated but she remains at a high risk for more acute and chronic complications which include CAD, CVA, CKD, retinopathy, and neuropathy. These are all discussed in detail with her.  The following Lifestyle Medicine recommendations according to American College of Lifestyle Medicine [facility]) were discussed and offered to patient and she agrees to start the journey:  A. Whole Foods, Plant-based plate comprising of fruits and vegetables, plant-based proteins, whole-grain carbohydrates was discussed in detail with the patient.   A list for source of those nutrients were also provided to the patient.  Patient will use only water or unsweetened tea for hydration. B.  The need to stay away from risky substances including alcohol, smoking; obtaining 7 to 9 hours of restorative sleep, at least 150 minutes of moderate intensity exercise weekly, the importance of healthy social connections,  and stress reduction techniques were discussed. C.  A full color page of  Calorie density of various food groups per pound showing examples of each food groups was provided to the patient.  - Nutritional counseling repeated/built upon at each appointment.  - The patient admits there is a room for improvement in their diet and drink choices. -  Suggestion is made for the patient to avoid simple carbohydrates from their diet including Cakes, Sweet Desserts / Pastries, Ice Cream, Soda (diet and regular), Sweet Tea, Candies, Chips, Cookies, Sweet Pastries, Store Bought Juices, Alcohol in Excess of 1-2 drinks a day, Artificial Sweeteners, Coffee Creamer, and Sugar-free  Products. This will help patient to have stable blood glucose profile and potentially avoid unintended weight gain.   - I encouraged the patient to switch to unprocessed or minimally processed complex starch and increased protein intake (animal or plant source), fruits, and vegetables.   - Patient is advised to stick to a routine mealtimes to eat 3 meals a day and avoid unnecessary snacks (to snack only to correct hypoglycemia).  - I have approached her with the following individualized plan to manage her diabetes and patient agrees:   - She is advised to continue her Tresiba  30 units SQ nightly.  She can continue her Mounjaro  15 mg SQ weekly, and Metformin  500 mg ER twice daily.    -she is encouraged to continue monitoring glucose 4 times daily (using her CGM), before meals and before bed, and to call the clinic if she has readings less than 70 or above 200 for 3 tests in a row.  - she is warned not to take insulin  without proper monitoring per orders. - Adjustment parameters are given to her for hypo and hyperglycemia in writing.  -She has been on several other medications for diabetes and did not tolerate them including: Invokana (yeast infections), Afrezza (chronic cough), Victoza (ineffective).  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  I do not feel this is impacting her diabetes from the available chart notes.  - Specific targets for  A1c; LDL, HDL, and Triglycerides were discussed with the patient.  2) Blood Pressure /Hypertension:  her blood pressure is controlled to target.   she is advised to continue her current medications as prescribed by PCP.  3) Lipids/Hyperlipidemia:    Review of her recent lipid panel from 03/18/24 showed controlled LDL at 55.  she is advised to continue Nexlizet 180-10 mg mg daily at bedtime and Crestor 20 mg po daily, and Vascepa 2 grams twice daily.  Side effects and precautions discussed  with her.  4)  Weight/Diet:  her Body mass index is 40.57 kg/m.  -  clearly complicating her diabetes care.   she is a candidate for weight loss. I discussed with her the fact that loss of 5 - 10% of her  current body weight will have the most impact on her diabetes management.  Exercise, and detailed carbohydrates information provided  -  detailed on discharge instructions.  5) Chronic Care/Health Maintenance: -she is on ACEI/ARB and Statin medications and is encouraged to initiate and continue to follow up with Ophthalmology, Dentist, Podiatrist at least yearly or according to recommendations, and advised to stay away from smoking. I have recommended yearly flu vaccine and pneumonia vaccine at least every 5 years; moderate intensity exercise for up to 150 minutes weekly; and sleep for at least 7 hours a day.  6) Hypercalcemia Recent CMP shows high calcium of 10.3.  It has been mildly elevated for several years.  She does have vitamin D  deficiency, is on replacement with Ergocalciferol .  She does have history of kidney stones.  She does not take any calcium supplements, no Tums.  She is prescribed hydrochlorothiazide but only takes it as needed for fluid (she cannot remember the last time she needed it).  She never has had bone density exam.  She does have family history of thyroid  cancer (brother), no known personal or family history of parathyroid  problems (although her brother had previous workup for high calcium as well).    Did the work up to rule out hyperparathyroidism, PTH was normal, PTH-rp was negative, Magnesium  and phosphorous were all normal, ruling out parathyroid  problem.  Likely related to excess calcium in diet.  - she is advised to maintain close follow up with Shona Norleen PEDLAR, MD for primary care needs, as well as her other providers for optimal and coordinated care.     I spent  39  minutes in the care of the patient today including review of labs from CMP, Lipids, Thyroid   Function, Hematology (current and previous including abstractions from other facilities); face-to-face time discussing  her blood glucose readings/logs, discussing hypoglycemia and hyperglycemia episodes and symptoms, medications doses, her options of short and long term treatment based on the latest standards of care / guidelines;  discussion about incorporating lifestyle medicine;  and documenting the encounter. Risk reduction counseling performed per USPSTF guidelines to reduce obesity and cardiovascular risk factors.     Please refer to Patient Instructions for Blood Glucose Monitoring and Insulin /Medications Dosing Guide  in media tab for additional information. Please  also refer to  Patient Self Inventory in the Media  tab for reviewed elements of pertinent patient history.  Arland Ada participated in the discussions, expressed understanding, and voiced agreement with the above plans.  All questions were answered  to her satisfaction. she is encouraged to contact clinic should she have any questions or concerns prior to her return visit.     Follow up plan: - Return in about 4 months (around 09/23/2024) for Diabetes F/U with A1c in office, No previsit labs, Bring meter and logs.   Benton Rio, [facility] [address] [place], [place] [address] Phone: [phone] Fax: [phone]  05/26/2024, 8:57 AM

## 2024-06-11 ENCOUNTER — Encounter: Payer: Self-pay | Admitting: Neurology

## 2024-06-11 ENCOUNTER — Ambulatory Visit: Admitting: Neurology

## 2024-06-11 VITALS — BP 117/78 | HR 70 | Ht 63.5 in | Wt 231.2 lb

## 2024-06-11 DIAGNOSIS — R413 Other amnesia: Secondary | ICD-10-CM | POA: Diagnosis not present

## 2024-06-11 DIAGNOSIS — R410 Disorientation, unspecified: Secondary | ICD-10-CM | POA: Insufficient documentation

## 2024-06-11 NOTE — Progress Notes (Signed)
 Chief Complaint  Patient presents with   New Patient (Initial Visit)    Pt in room 14. Alone. paper referral for Forgetfulness. MOCA:25      ASSESSMENT AND PLAN  Loretta Luna is a 53 y.o. 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   MEDICAL HISTORY:  Loretta Luna, is a 53 year old female seen in request by her primary care doctor Shona Rush is in confusion, slow worsening memory loss, initial evaluation June 11, 2024  History is obtained from the patient and review of electronic medical records. I personally reviewed pertinent available imaging films in PACS.   PMHx of  DM since 2010 HTN HLD Obesity, under weight management Polycystic ovarian disease. 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[employer], desk job, denied difficulty handling his job, but since 2023, she began to take frequent note to keep things on track, she contributed those to age-related changes, in addition, she suffered long history of obesity, with some rapid weight shift over the past couple years  But the most bothersome symptoms are those scary episode  It only happen intermittently, the first episode happened when she was 53 years old, she was  babysitting, driving on her familiar route one evening, somehow she got lost, began to panic, has to call her family for guidance  There was another episode around 2015, she and her family drove to their yearly beach trip, when they stopped to get the gas, she suddenly realized she did not know where she was, how could she collect a destination, she has to be guided for about 30 minutes, then she snapped out of the brain fog  There was also an episode she forgot to turn off her stove recently  She now so worried about leave stove burned, she will avoid cooking sometimes  There was no significant family history of memory loss, she does has depression anxiety, was treated in the past, denies significant mood disorder now  MoCA examination 25/30, only missed 1 out of 5 delayed recall  Laboratory evaluation in October 2025: A1c 7.3, normal CMP other than mild elevation of glucose 128, parathyroid  hormone panel, amylase folic acid vitamin D  TSH, mild elevation of lipase, lipid panel triglyceride 245, LDL 55, Normal B12 833  PHYSICAL EXAM:   Vitals:   06/11/24 1051  BP: 117/78  Pulse: 70  SpO2: 98%  Weight: 231 lb 3.2 oz (104.9 kg)  Height: 5' 3.5 (1.613 m)    Body mass index is 40.31 kg/m.  PHYSICAL EXAMNIATION:  Gen: NAD, conversant, well nourised, well groomed                     Cardiovascular: Regular rate rhythm, no peripheral edema, warm, nontender. Eyes: Conjunctivae clear without exudates or hemorrhage Neck: Supple, no carotid bruits. Pulmonary: Clear  to auscultation bilaterally   NEUROLOGICAL EXAM:  MENTAL STATUS: Speech/cognition: Awake, alert, oriented to history taking and casual conversation    06/11/2024   10:56 AM  Montreal Cognitive Assessment   Visuospatial/ Executive (0/5) 3  Naming (0/3) 3  Attention: Read list of digits (0/2) 2  Attention: Read list of letters (0/1) 1  Attention: Serial 7 subtraction starting at 100 (0/3) 3  Language: Repeat phrase  (0/2) 1  Language : Fluency (0/1) 0  Abstraction (0/2) 2  Delayed Recall (0/5) 4  Orientation (0/6) 6  Total 25    CRANIAL NERVES: CN II: Visual fields are full to confrontation. Pupils are round equal and briskly reactive to light. CN III, IV, VI: extraocular movement are normal. No ptosis. CN V: Facial sensation is intact to light touch CN VII: Face is symmetric with normal eye closure  CN VIII: Hearing is normal to causal conversation. CN IX, X: Phonation is normal. CN XI: Head turning and shoulder shrug are intact  MOTOR: There is no pronator drift of out-stretched arms. Muscle bulk and tone are normal. Muscle strength is normal.  REFLEXES: Reflexes are 2+ and symmetric at the biceps, triceps, knees, and ankles. Plantar responses are flexor.  SENSORY: Intact to light touch, pinprick and vibratory sensation are intact in fingers and toes.  COORDINATION: There is no trunk or limb dysmetria noted.  GAIT/STANCE: Obese, push-up to get up from seated position, steady  REVIEW OF SYSTEMS:  Full 14 system review of systems performed and notable only for as above All other review of systems were negative.   ALLERGIES: Allergies[1]  HOME MEDICATIONS: Current Outpatient Medications  Medication Sig Dispense Refill   Bempedoic Acid-Ezetimibe (NEXLIZET) 180-10 MG TABS Take by mouth.     Continuous Glucose Sensor (DEXCOM G7 SENSOR) MISC Change sensor every 10 days as directed. 9 each 3   furosemide (LASIX) 20 MG tablet Take 20 mg by mouth as needed.     HYDROCHLOROTHIAZIDE PO Take by mouth as needed.     icosapent Ethyl (VASCEPA) 1 g capsule Take 2 g by mouth 2 (two) times daily.     Insulin  Pen Needle (PEN NEEDLES) 31G X 8 MM MISC Use to inject insulin  once daily 100 each 6   loratadine (CLARITIN) 10 MG tablet Take 10 mg by mouth daily.     Magnesium  250 MG TABS May use CALM supplement- 325mg  mag 1-twice daily (Patient taking differently: May use CALM supplement- 325mg  mag 2  pills -twice daily)     metFORMIN  (GLUCOPHAGE -XR) 500 MG 24 hr tablet Take 1 tablet (500 mg total) by mouth 2 (two) times daily with a meal. 90 tablet 3   olmesartan  (BENICAR ) 20 MG tablet Take 1 tablet (20 mg total) by mouth daily. 90 tablet 1   OVER THE COUNTER MEDICATION at bedtime. Cranberry Extract 36 mg - patient takes 1 by mouth at bedtime.     potassium chloride  (KLOR-CON ) 10 MEQ tablet Take 10 mEq by mouth 2 (two) times daily.     rosuvastatin (CRESTOR) 20 MG tablet Take 20 mg by mouth daily.     tirzepatide  (MOUNJARO ) 15 MG/0.5ML Pen Inject 15 mg into the skin once a week. 6 mL 3   TRESIBA  FLEXTOUCH 200 UNIT/ML FlexTouch Pen Inject 60 Units into the skin at bedtime. 30 mL 3   vitamin B-12 (CYANOCOBALAMIN ) 500 MCG tablet Take 500 mcg by mouth daily.     Vitamin D , Ergocalciferol , (DRISDOL ) 1.25 MG (50000 UNIT) CAPS capsule Take 1  capsule (50,000 Units total) by mouth every 7 (seven) days. 12 capsule 0   No current facility-administered medications for this visit.    PAST MEDICAL HISTORY: Past Medical History:  Diagnosis Date   Anxiety    Asthma    Back pain    Chest pain    Constipation    Depression    Diabetes ([name])    Essential hypertension    Fatty liver    GERD (gastroesophageal reflux disease)    History of swelling of feet    Hypercalcemia    Hyperglycemia    Hypokalemia    IBS (irritable bowel syndrome)    Infertility, female    Insomnia    Joint pain    Mixed hyperlipidemia    Obesity    Pancreatic cyst    Polycystic ovarian syndrome    Proteinuria    Systolic murmur    Type 2 diabetes mellitus ([name])     PAST SURGICAL HISTORY: Past Surgical History:  Procedure Laterality Date   CHOLECYSTECTOMY      FAMILY HISTORY: Family History  Problem Relation Age of Onset   COPD Mother    Hyperlipidemia Mother    Hypertension Mother    Stroke Mother    Diabetes Mother    Heart disease Mother    Obesity Mother    Hyperlipidemia Father    Hypertension  Father    Stroke Father    Diabetes Father    Sleep apnea Father    Obesity Father    Hypertension Sister    Asthma Brother    Hyperlipidemia Brother    Hypertension Brother    Cancer Maternal Grandmother    COPD Maternal Grandfather    Hyperlipidemia Maternal Grandfather    Cancer Paternal Grandmother    Stroke Paternal Grandfather     SOCIAL HISTORY: Social History   Socioeconomic History   Marital status: Single    Spouse name: Not on file   Number of children: Not on file   Years of education: Not on file   Highest education level: Not on file  Occupational History   Not on file  Tobacco Use   Smoking status: Never   Smokeless tobacco: Never  Vaping Use   Vaping status: Never Used  Substance and Sexual Activity   Alcohol use: No    Alcohol/week: 0.0 standard drinks of alcohol   Drug use: Never   Sexual activity: Not Currently  Other Topics Concern   Not on file  Social History Narrative   Not on file   Social Drivers of Health   Tobacco Use: Low Risk (06/11/2024)   Patient History    Smoking Tobacco Use: Never    Smokeless Tobacco Use: Never    Passive Exposure: Not on file  Financial Resource Strain: Not on file  Food Insecurity: Not on file  Transportation Needs: Not on file  Physical Activity: Not on file  Stress: Not on file  Social Connections: Not on file  Intimate Partner Violence: Not on file  Depression (EYV7-0): Not on file  Alcohol Screen: Not on file  Housing: Not on file  Utilities: Not on file  Health Literacy: Not on file      Modena Callander, M.D. Ph.D.  [facility] [address] [place], [place] [address] Ph: [phone] Fax: [phone]  CC:  Joeann Browning, FNP [address] [place],  [place] [address]  Shona Norleen PEDLAR, MD       [1]  Allergies  Allergen Reactions   Iodinated Contrast Media Hives and Swelling    Ok with 13 hour premed   Food     shellfish   Iodine     REACTION:  swelling, hives, tachypnea   Metformin  And Related Diarrhea

## 2024-06-15 NOTE — Progress Notes (Unsigned)
 SUBJECTIVE: Discussed the use of AI scribe software for clinical note transcription with the patient, who gave verbal consent to proceed.  Chief Complaint: Obesity  Interim History: She is up 4 lbs since her last visit.  Down 27 lbs overall   Loretta Luna is here to discuss her progress with her obesity treatment plan. She is on the Category 3 Plan and states she is following her eating plan approximately 25 % of the time. She states she is not exercising following a recent fall.   Loretta Luna is a 53 year old female who presents for follow-up of her obesity treatment plan.  She faces challenges in maintaining her diet due to social events and stress at work, leading to consumption of foods not aligned with her dietary plan, such as chips, cookies, and pasta. These dietary choices have affected her blood sugar levels. Despite these challenges, she has made efforts to restock her home with healthy options like lean meats, yogurt, and vegetables and is getting back on track with her plan.  She has type 2 diabetes, with her last A1c increasing from 6.7 to 7.6. She is currently on metformin  500 mg twice daily, Tresiba  insulin  30 units nightly, and Mounjaro  15 mg weekly. She uses a continuous glucose monitor and experiences fluctuating blood sugar levels, with a recent period of normal readings followed by an increase due to dietary indiscretions.  She has hypertension and hyperlipidemia. Stress at work contributes to her dietary challenges and overall health management.  She has a history of vitamin D  and B12 deficiencies, for which she takes supplements. She needs a refill for her vitamin D .  She experiences disordered sleep but notes some improvement recently, achieving about seven hours of sleep per night. She has implemented strategies to improve her sleep, such as setting a regular bedtime and managing her dog's nighttime needs.  She has a history of intermittent confusion and is undergoing a  workup for possible early cognitive impairment. She was recently seen by neurology and had an elevated calcium level, but her parathyroid  hormone was normal. An EEG and brain imaging have been recommended, and she is awaiting results from ATN profile testing.  She has a known pancreatic cyst, which is being monitored by general surgery. Recent imaging showed no change, and follow-up is planned every two years.  She reports a recent fall on her basement steps, resulting in neck, shoulder, and leg pain, which lasted about a week. She attributes the fall to possible neuropathy in her feet, as she has difficulty sensing her steps. OBJECTIVE: Visit Diagnoses: Problem List Items Addressed This Visit     Hyperlipidemia associated with type 2 diabetes mellitus ([name])   Hypertension associated with diabetes ([name])   Vitamin D  deficiency   Relevant Medications   Vitamin D , Ergocalciferol , (DRISDOL ) 1.25 MG (50000 UNIT) CAPS capsule   Obesity ([name])- Starting BMI 45.06/DATE 07/12/22   Diabetes mellitus ([name]) - Primary   Other Visit Diagnoses       Stress       Relevant Medications   buPROPion  (WELLBUTRIN  SR) 150 MG 12 hr tablet      Obesity Recent weight gain due to dietary indiscretions. Current weight is 231 lbs. Muscle mass has increased, and there is no significant increase in adipose tissue. Water weight may be contributing to weight gain. - Prescribed bupropion  150 mg daily to aid with cravings and focus. - Encouraged dietary modifications to support weight loss. - Scheduled follow-up in 4 weeks to assess progress  and medication efficacy.  Type 2 diabetes mellitus Recent increase in A1c from 6.7 to 7.6. Blood sugars have been fluctuating, with a recent period of normal readings followed by a return to elevated levels due to dietary choices. - Continue metformin  500 mg twice daily. - Continue Tresiba  insulin  30 units once nightly. - Continue Mounjaro  15 mg once weekly.  Hypertension On  olmesartan  20 mg daily- no SE. BP well controlled.        Lasix 20 mg daily as needed and KCL BP Readings from Last 3 Encounters:  06/16/24 125/65  06/11/24 117/78  05/26/24 114/76   Lab Results  Component Value Date   NA 142 04/15/2024   CL 103 04/15/2024   K 4.5 04/15/2024   CO2 24 04/15/2024   BUN 13 04/15/2024   CREATININE 0.74 04/15/2024   EGFR 96 04/15/2024   CALCIUM 10.2 04/15/2024   PHOS 3.9 10/09/2023   ALBUMIN 4.7 04/15/2024   GLUCOSE 128 (H) 04/15/2024   Continue to work on nutrition plan to promote weight loss and improve BP control.  Continue olmesartan  20 mg daily. Potential for bupropion  to increase blood pressure,. - Monitor blood pressure regularly, especially after starting bupropion .  Vitamin D  Deficiency Vitamin D  is not at goal of 50.  Most recent vitamin D  level was 40. She is on  prescription ergocalciferol  50,000 IU weekly. No N/V or muscle weakness with ERgocalciferol  Lab Results  Component Value Date   VD25OH 39.9 03/18/2024   VD25OH 40.0 12/04/2023   VD25OH 37.3 10/09/2023    Plan: Continue and refill  prescription ergocalciferol  50,000 IU weekly Low vitamin D  levels can be associated with adiposity and may result in leptin resistance and weight gain. Also associated with fatigue.  Currently on vitamin D  supplementation without any adverse effects such as nausea, vomiting or muscle weakness.    Sleep disorder Reports improved sleep but still not achieving 7 hours per night. Recent changes in sleep habits include earlier bedtime and reduced nighttime interruptions. Plan: Continue to monitor and work on strategies to improve sleep.   Stress/emotional eating Significant stress related to work and personal life. Bupropion  may help with stress management and focus. - Prescribed bupropion  150 mg daily to aid with stress eating/emotional eating and focus. Meds ordered this encounter  Medications   buPROPion  (WELLBUTRIN  SR) 150 MG 12 hr tablet     Sig: Take 1 tablet (150 mg total) by mouth daily.    Dispense:  30 tablet    Refill:  2   Vitamin D , Ergocalciferol , (DRISDOL ) 1.25 MG (50000 UNIT) CAPS capsule    Sig: Take 1 capsule (50,000 Units total) by mouth every 7 (seven) days.    Dispense:  12 capsule    Refill:  0    Vitals Temp: 97.8 F (36.6 C) BP: 125/65 Pulse Rate: 81 SpO2: 95 %   Anthropometric Measurements Height: 5' 3 (1.6 m) Weight: 227 lb (103 kg) BMI (Calculated): 40.22 Weight at Last Visit: 223 lb Weight Lost Since Last Visit: 0 Weight Gained Since Last Visit: 4 lb Starting Weight: 254 lb Total Weight Loss (lbs): 27 lb (12.2 kg)   Body Composition  Body Fat %: 46.4 % Fat Mass (lbs): 105.4 lbs Muscle Mass (lbs): 115.4 lbs Total Body Water (lbs): 82.4 lbs Visceral Fat Rating : 14   Other Clinical Data Fasting: Yes Labs: No Today's Visit #: 24 Starting Date: 07/12/22     ASSESSMENT AND PLAN:  Diet: Raisha is currently in the action stage  of change. As such, her goal is to continue with weight loss efforts. She has agreed to Category 3 Plan.  Exercise: Lilyan has been instructed to work up to a goal of 150 minutes of combined cardio and strengthening exercise per week, to try a geriatric exercise plan, and that some exercise is better than none for weight loss and overall health benefits.   Behavior Modification:  We discussed the following Behavioral Modification Strategies today: increasing lean protein intake, decreasing simple carbohydrates, increasing vegetables, increase H2O intake, increase high fiber foods, no skipping meals, meal planning and cooking strategies, emotional eating strategies , holiday eating strategies, avoiding temptations, and planning for success. We discussed various medication options to help Genea with her weight loss efforts and we both agreed to start wellbutrin  for cravings/stress eating/emotional eating and continue all other medications.  Return in about 4  weeks (around 07/14/2024).SABRA She was informed of the importance of frequent follow up visits to maximize her success with intensive lifestyle modifications for her multiple health conditions.  Attestation Statements:   Reviewed by clinician on day of visit: allergies, medications, problem list, medical history, surgical history, family history, social history, and previous encounter notes.   Time spent on visit including pre-visit chart review and post-visit care and charting was 41 minutes.    Loretta Formisano, PA-C

## 2024-06-16 ENCOUNTER — Ambulatory Visit (INDEPENDENT_AMBULATORY_CARE_PROVIDER_SITE_OTHER): Admitting: Physician Assistant

## 2024-06-16 ENCOUNTER — Encounter (INDEPENDENT_AMBULATORY_CARE_PROVIDER_SITE_OTHER): Payer: Self-pay | Admitting: Physician Assistant

## 2024-06-16 VITALS — BP 125/65 | HR 81 | Temp 97.8°F | Ht 63.0 in | Wt 227.0 lb

## 2024-06-16 DIAGNOSIS — Z6841 Body Mass Index (BMI) 40.0 and over, adult: Secondary | ICD-10-CM

## 2024-06-16 DIAGNOSIS — E1169 Type 2 diabetes mellitus with other specified complication: Secondary | ICD-10-CM | POA: Diagnosis not present

## 2024-06-16 DIAGNOSIS — E559 Vitamin D deficiency, unspecified: Secondary | ICD-10-CM | POA: Diagnosis not present

## 2024-06-16 DIAGNOSIS — Z72820 Sleep deprivation: Secondary | ICD-10-CM

## 2024-06-16 DIAGNOSIS — E1159 Type 2 diabetes mellitus with other circulatory complications: Secondary | ICD-10-CM | POA: Diagnosis not present

## 2024-06-16 DIAGNOSIS — F439 Reaction to severe stress, unspecified: Secondary | ICD-10-CM

## 2024-06-16 DIAGNOSIS — I152 Hypertension secondary to endocrine disorders: Secondary | ICD-10-CM

## 2024-06-16 DIAGNOSIS — I10 Essential (primary) hypertension: Secondary | ICD-10-CM | POA: Diagnosis not present

## 2024-06-16 MED ORDER — VITAMIN D (ERGOCALCIFEROL) 1.25 MG (50000 UNIT) PO CAPS: 50000.0000 [IU] | ORAL_CAPSULE | ORAL | 0 refills | Status: AC

## 2024-06-16 MED ORDER — BUPROPION HCL ER (SR) 150 MG PO TB12: 150.0000 mg | ORAL_TABLET | Freq: Every day | ORAL | 2 refills | Status: AC

## 2024-06-17 LAB — ATN PROFILE
A -- Beta-amyloid 42/40 Ratio: 0.124 (ref >0.102)
Beta-amyloid 40: 131.15 pg/mL
Beta-amyloid 42: 16.22 pg/mL
N -- NfL, Plasma: 1.06 pg/mL (ref 0.00–2.99)
T -- p-tau181: 0.33 pg/mL (ref 0.00–0.95)

## 2024-06-22 ENCOUNTER — Other Ambulatory Visit: Admitting: *Deleted

## 2024-06-22 ENCOUNTER — Telehealth: Payer: Self-pay | Admitting: *Deleted

## 2024-06-22 NOTE — Telephone Encounter (Signed)
 Prt called to Cancel appt due to being sick   Appt canceled

## 2024-07-13 NOTE — Progress Notes (Unsigned)
 "  SUBJECTIVE: Discussed the use of AI scribe software for clinical note transcription with the patient, who gave verbal consent to proceed.  Chief Complaint: Obesity  Interim History: She is down 2 lbs since her last visit.  Down 29 lbs overall TBW loss of 11.4%  Loretta Luna is here to discuss her progress with her obesity treatment plan. She is on the Category 3 Plan Loretta states she is following her eating plan approximately 25 % of the time. She states she is exercising walking 20-30 minutes 2 times per week. Loretta Luna is a 54 year old female with type two diabetes, hyperlipidemia, Loretta hypertension who presents for follow-up on her BCD treatment plan.  She is currently on Mounjaro  15 mg once weekly, metformin  500 mg twice daily, Loretta Triacetin insulin  for diabetes management. Her blood sugars fluctuate depending on her diet Loretta exercise. Since her last visit, she has lost two pounds, totaling a weight loss of 29 pounds. Her last A1c was 7.6% in September.  She follows a category three nutrition plan about 25% of the time, getting the recommended amount of protein but not drinking adequate water. She is not skipping meals Loretta sleeps at least seven hours per night most of the time. She walks 20 to 30 minutes twice a week. She has been eating salmon Loretta cabbage soup, Loretta occasionally indulges in foods like pasta Loretta homemade meals from family gatherings. She is mindful of her carbohydrate intake Loretta is exploring ways to balance her diet better.  She occasionally forgets to take Wellbutrin  but feels it has been beneficial overall, despite experiencing a few days of depression. She monitors her blood pressure at home every night, which she finds helps her rest. She reports better sleep overall, despite some days of increased fatigue where she stayed in pajamas Loretta napped all day.  She is currently taking vitamin D  Loretta magnesium  supplements. She is unsure if magnesium  is helping but continues to take  it.  OBJECTIVE: Visit Diagnoses: Problem List Items Addressed This Visit     Hypertension associated with diabetes ([name])   Vitamin D  deficiency   Obesity ([name])- Starting BMI 45.06/DATE 07/12/22   Diabetes mellitus ([name]) - Primary   BMI 39.0-39.9,adult Current BMI 39.6   Other Visit Diagnoses       Stress          Type 2 diabetes mellitus with other specified complication Type 2 diabetes managed with Mounjaro , metformin , Loretta Triacetin insulin . Blood sugars are well-managed with occasional fluctuations due to dietary choices. Last A1c was 7.6% in September. Mounjaro  is effective for weight loss Loretta diabetic control. Discussed potential future use of a triple agonist drug for better outcomes, but not yet available. Current regimen is optimal given insurance coverage limitations. - Continue Mounjaro  15 mg once weekly, metformin  500 mg twice daily, Loretta Triacetin insulin . - Monitor blood sugars regularly. - Will consider future use of triple agonist drug when available.  Morbid obesity Managed with Mounjaro  Loretta lifestyle modifications. Weight loss of 29 pounds achieved. Current regimen is effective for weight loss. Discussed potential future use of a triple agonist drug for better outcomes, but not yet available. Emphasized importance of dietary tracking Loretta portion control. - Continue Mounjaro  15 mg once weekly. - Encouraged use of [employer] It app for dietary tracking. - Maintain calorie intake around 1500-1600 calories per day with 100+ grams of protein Loretta 25+ grams of fiber daily. - Consider using [employer] Meals for meal options.  Hypertension Slightly elevated  blood pressure, possibly due to Wellbutrin . No home monitoring currently. Discussed potential impact of Wellbutrin  on blood pressure. - Monitor blood pressure regularly. - Continue Wellbutrin  with caution, monitor for blood pressure changes.  Vitamin D  deficiency - Continue vitamin D  supplementation.  Stress Managed with  Wellbutrin . Reports improvement in mood Loretta ability to cope with stressors. Discussed benefits of Wellbutrin  in managing stress Loretta irritability. - Continue Wellbutrin  as prescribed. - Monitor for any changes in mood or blood pressure. Vitals Temp: 97.9 F (36.6 C) BP: 134/84 Pulse Rate: 84 SpO2: 96 %   Anthropometric Measurements Height: 5' 3 (1.6 m) Weight: 225 lb (102.1 kg) BMI (Calculated): 39.87 Weight at Last Visit: 227 lb Weight Lost Since Last Visit: 2 lb Weight Gained Since Last Visit: 0 Starting Weight: 254 lb Total Weight Loss (lbs): 29 lb (13.2 kg)   Body Composition  Body Fat %: 46.7 % Fat Mass (lbs): 105.2 lbs Muscle Mass (lbs): 114.2 lbs Total Body Water (lbs): 82.8 lbs Visceral Fat Rating : 14   Other Clinical Data Fasting: no Labs: no Today's Visit #: 25 Starting Date: 07/12/22     ASSESSMENT Loretta PLAN:  Diet: Loretta Luna is currently in the action stage of change. As such, her goal is to continue with weight loss efforts. She has agreed to Category 3 Plan Loretta keeping a food journal Loretta adhering to recommended goals of 1500-1600 calories Loretta 100 grams of protein.  Exercise: Loretta Luna has been instructed to work up to a goal of 150 minutes of combined cardio Loretta strengthening exercise per week for weight loss Loretta overall health benefits.   Behavior Modification:  We discussed the following Behavioral Modification Strategies today: increasing lean protein intake, decreasing simple carbohydrates, increasing vegetables, increase H2O intake, increase high fiber foods, no skipping meals, meal planning Loretta cooking strategies, emotional eating strategies , avoiding temptations, planning for success, Loretta keep a strict food journal. We discussed various medication options to help Loretta Luna with her weight loss efforts Loretta we both agreed to continue Mounjaro  Loretta metformin  as well as Tresiba  insulin  for T2DM management Loretta medical weight loss.  Return in about 4 weeks  (around 08/11/2024).Luna She was informed of the importance of frequent follow up visits to maximize her success with intensive lifestyle modifications for her multiple health conditions.  Attestation Statements:   Reviewed by clinician on day of visit: allergies, medications, problem list, medical history, surgical history, family history, social history, Loretta previous encounter notes.   Time spent on visit including pre-visit chart review Loretta post-visit care Loretta charting was *** minutes.    Angles Trevizo, PA-C  "

## 2024-07-14 ENCOUNTER — Ambulatory Visit (INDEPENDENT_AMBULATORY_CARE_PROVIDER_SITE_OTHER): Admitting: Physician Assistant

## 2024-07-14 ENCOUNTER — Encounter (INDEPENDENT_AMBULATORY_CARE_PROVIDER_SITE_OTHER): Payer: Self-pay | Admitting: Physician Assistant

## 2024-07-14 VITALS — BP 134/84 | HR 84 | Temp 97.9°F | Ht 63.0 in | Wt 225.0 lb

## 2024-07-14 DIAGNOSIS — Z7985 Long-term (current) use of injectable non-insulin antidiabetic drugs: Secondary | ICD-10-CM | POA: Diagnosis not present

## 2024-07-14 DIAGNOSIS — E559 Vitamin D deficiency, unspecified: Secondary | ICD-10-CM | POA: Diagnosis not present

## 2024-07-14 DIAGNOSIS — E1169 Type 2 diabetes mellitus with other specified complication: Secondary | ICD-10-CM | POA: Diagnosis not present

## 2024-07-14 DIAGNOSIS — I10 Essential (primary) hypertension: Secondary | ICD-10-CM

## 2024-07-14 DIAGNOSIS — Z6839 Body mass index (BMI) 39.0-39.9, adult: Secondary | ICD-10-CM

## 2024-07-14 DIAGNOSIS — Z794 Long term (current) use of insulin: Secondary | ICD-10-CM

## 2024-07-14 DIAGNOSIS — F439 Reaction to severe stress, unspecified: Secondary | ICD-10-CM

## 2024-07-14 DIAGNOSIS — E1159 Type 2 diabetes mellitus with other circulatory complications: Secondary | ICD-10-CM | POA: Diagnosis not present

## 2024-07-23 ENCOUNTER — Encounter (INDEPENDENT_AMBULATORY_CARE_PROVIDER_SITE_OTHER): Payer: Self-pay | Admitting: Physician Assistant

## 2024-08-18 ENCOUNTER — Ambulatory Visit (INDEPENDENT_AMBULATORY_CARE_PROVIDER_SITE_OTHER): Admitting: Physician Assistant

## 2024-09-24 ENCOUNTER — Ambulatory Visit: Admitting: Nurse Practitioner

## 2025-01-12 ENCOUNTER — Ambulatory Visit: Admitting: Neurology
